# Patient Record
Sex: Female | Born: 1935 | Race: White | Hispanic: No
Health system: Southern US, Community
[De-identification: ages and names within clinical notes are randomized; demographics above are authoritative.]

---

## 2003-06-11 ENCOUNTER — Other Ambulatory Visit: Payer: Self-pay

## 2003-06-12 ENCOUNTER — Other Ambulatory Visit: Payer: Self-pay

## 2005-12-09 ENCOUNTER — Inpatient Hospital Stay: Payer: Self-pay | Admitting: Internal Medicine

## 2005-12-09 ENCOUNTER — Other Ambulatory Visit: Payer: Self-pay

## 2007-04-22 ENCOUNTER — Ambulatory Visit: Payer: Self-pay | Admitting: Physician Assistant

## 2007-05-12 ENCOUNTER — Emergency Department: Payer: Self-pay | Admitting: Emergency Medicine

## 2007-06-16 ENCOUNTER — Ambulatory Visit: Payer: Self-pay | Admitting: Cardiology

## 2007-06-16 ENCOUNTER — Other Ambulatory Visit: Payer: Self-pay

## 2007-06-16 ENCOUNTER — Ambulatory Visit: Payer: Self-pay | Admitting: Orthopedic Surgery

## 2007-06-17 ENCOUNTER — Ambulatory Visit: Payer: Self-pay | Admitting: Orthopedic Surgery

## 2007-08-07 ENCOUNTER — Encounter: Admission: RE | Admit: 2007-08-07 | Discharge: 2007-08-07 | Payer: Self-pay | Admitting: Family Medicine

## 2007-08-08 ENCOUNTER — Ambulatory Visit: Payer: Self-pay | Admitting: Internal Medicine

## 2007-08-08 ENCOUNTER — Inpatient Hospital Stay (HOSPITAL_COMMUNITY): Admission: EM | Admit: 2007-08-08 | Discharge: 2007-08-19 | Payer: Self-pay | Admitting: Emergency Medicine

## 2007-08-08 ENCOUNTER — Ambulatory Visit: Payer: Self-pay | Admitting: Emergency Medicine

## 2007-08-08 ENCOUNTER — Encounter (INDEPENDENT_AMBULATORY_CARE_PROVIDER_SITE_OTHER): Payer: Self-pay | Admitting: General Surgery

## 2007-08-13 ENCOUNTER — Ambulatory Visit: Payer: Self-pay | Admitting: Infectious Diseases

## 2007-08-22 ENCOUNTER — Ambulatory Visit: Payer: Self-pay

## 2007-09-15 ENCOUNTER — Encounter: Admission: RE | Admit: 2007-09-15 | Discharge: 2007-09-15 | Payer: Self-pay | Admitting: Family Medicine

## 2007-09-15 ENCOUNTER — Inpatient Hospital Stay (HOSPITAL_COMMUNITY): Admission: EM | Admit: 2007-09-15 | Discharge: 2007-09-17 | Payer: Self-pay | Admitting: Emergency Medicine

## 2007-10-01 ENCOUNTER — Ambulatory Visit (HOSPITAL_COMMUNITY): Admission: RE | Admit: 2007-10-01 | Discharge: 2007-10-01 | Payer: Self-pay | Admitting: Urology

## 2007-10-20 ENCOUNTER — Encounter: Admission: RE | Admit: 2007-10-20 | Discharge: 2007-10-20 | Payer: Self-pay | Admitting: Family Medicine

## 2007-10-26 ENCOUNTER — Ambulatory Visit (HOSPITAL_COMMUNITY): Admission: RE | Admit: 2007-10-26 | Discharge: 2007-10-26 | Payer: Self-pay | Admitting: Orthopedic Surgery

## 2007-11-04 ENCOUNTER — Encounter: Admission: RE | Admit: 2007-11-04 | Discharge: 2007-11-04 | Payer: Self-pay | Admitting: Family Medicine

## 2007-11-27 ENCOUNTER — Encounter: Admission: RE | Admit: 2007-11-27 | Discharge: 2007-11-27 | Payer: Self-pay | Admitting: Family Medicine

## 2008-01-04 ENCOUNTER — Encounter: Admission: RE | Admit: 2008-01-04 | Discharge: 2008-01-04 | Payer: Self-pay | Admitting: Family Medicine

## 2008-09-19 ENCOUNTER — Ambulatory Visit: Payer: Self-pay | Admitting: Vascular Surgery

## 2008-11-12 ENCOUNTER — Emergency Department: Payer: Self-pay | Admitting: Emergency Medicine

## 2009-01-30 ENCOUNTER — Ambulatory Visit: Payer: Self-pay | Admitting: Vascular Surgery

## 2009-02-06 ENCOUNTER — Ambulatory Visit: Payer: Self-pay | Admitting: Vascular Surgery

## 2009-02-25 IMAGING — CR DG ANKLE COMPLETE 3+V*L*
1 series · 4 of 4 positions shown · non-contrast
Comparison: none

REASON FOR EXAM: Left leg pain, weak pulse
COMMENTS:

PROCEDURE:     DXR - DXR ANKLE LEFT COMPLETE  - April 22, 2007 [DATE]
RESULT:     Images of the LEFT ankle demonstrate some mild degenerative
changes and osteopenia with no definite fracture, dislocation or radiopaque
foreign body.

[Series 1: view not recorded · 0.17mm/px · 4 of 4 slices shown]
[im 1/4]
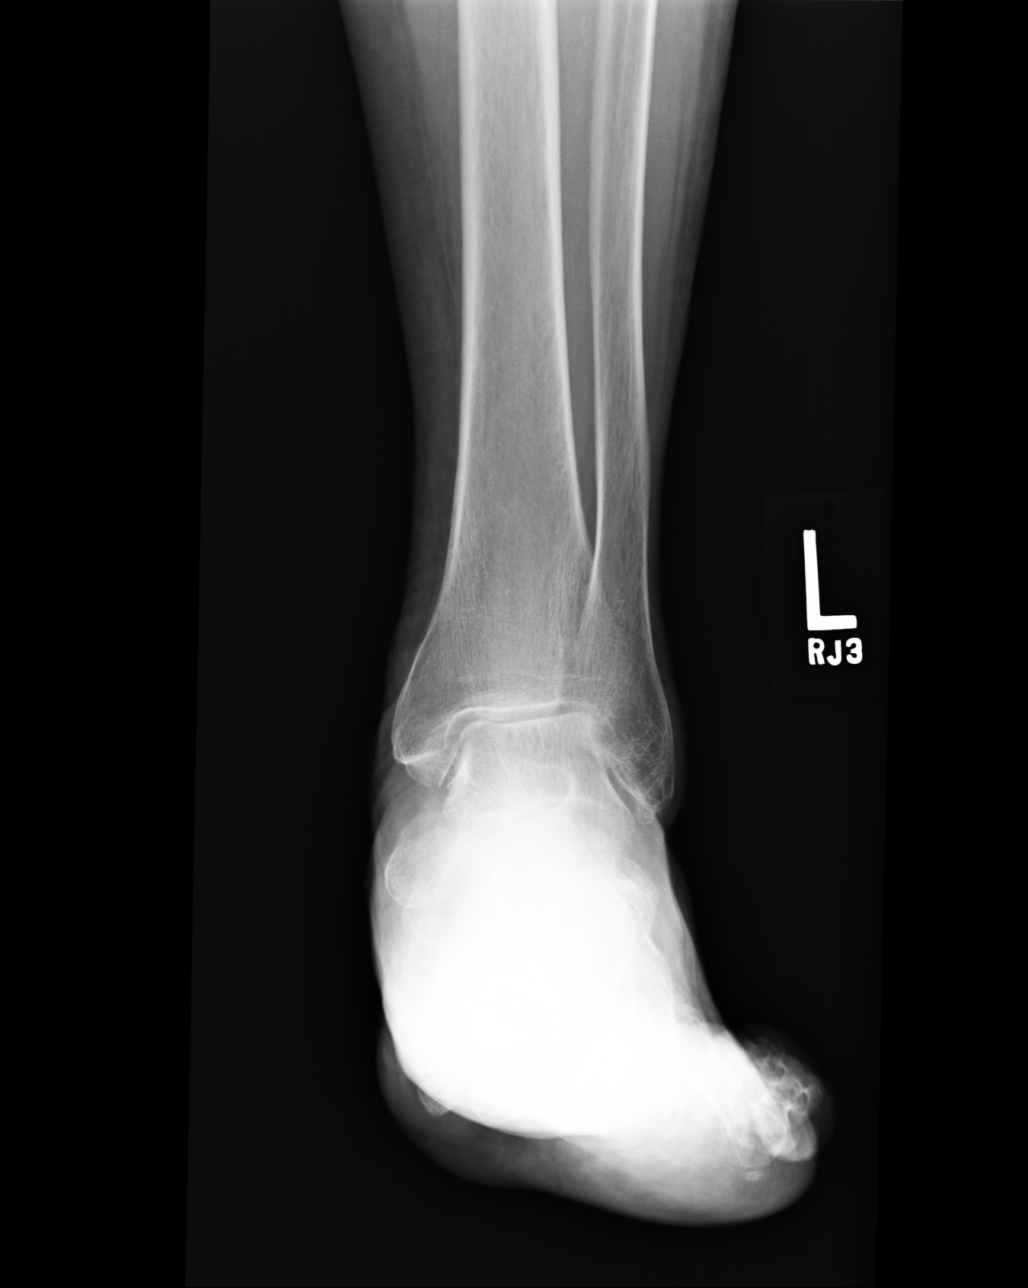
[im 2/4]
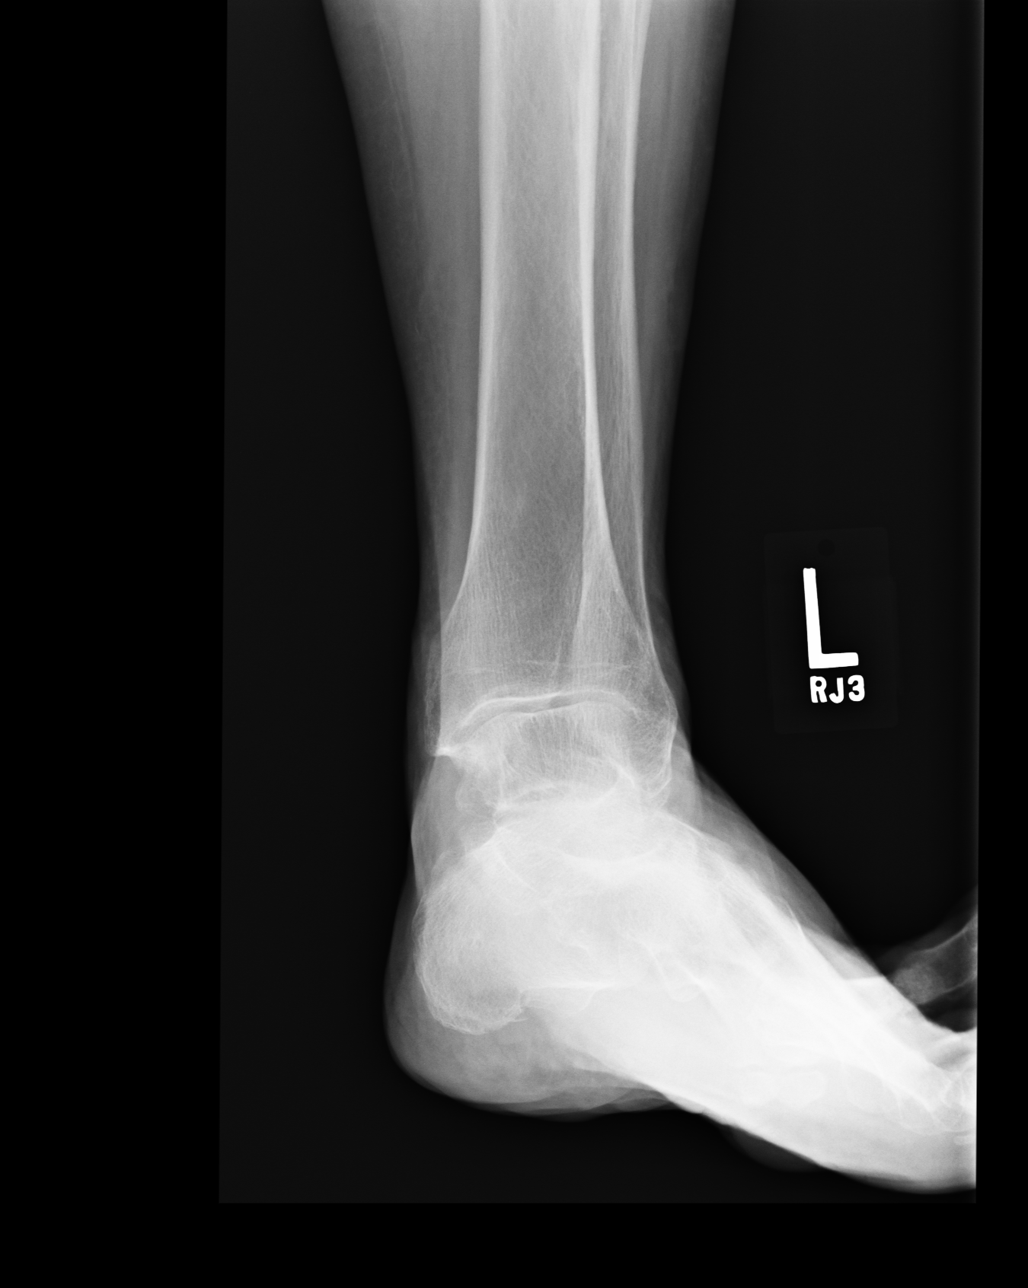
[im 3/4]
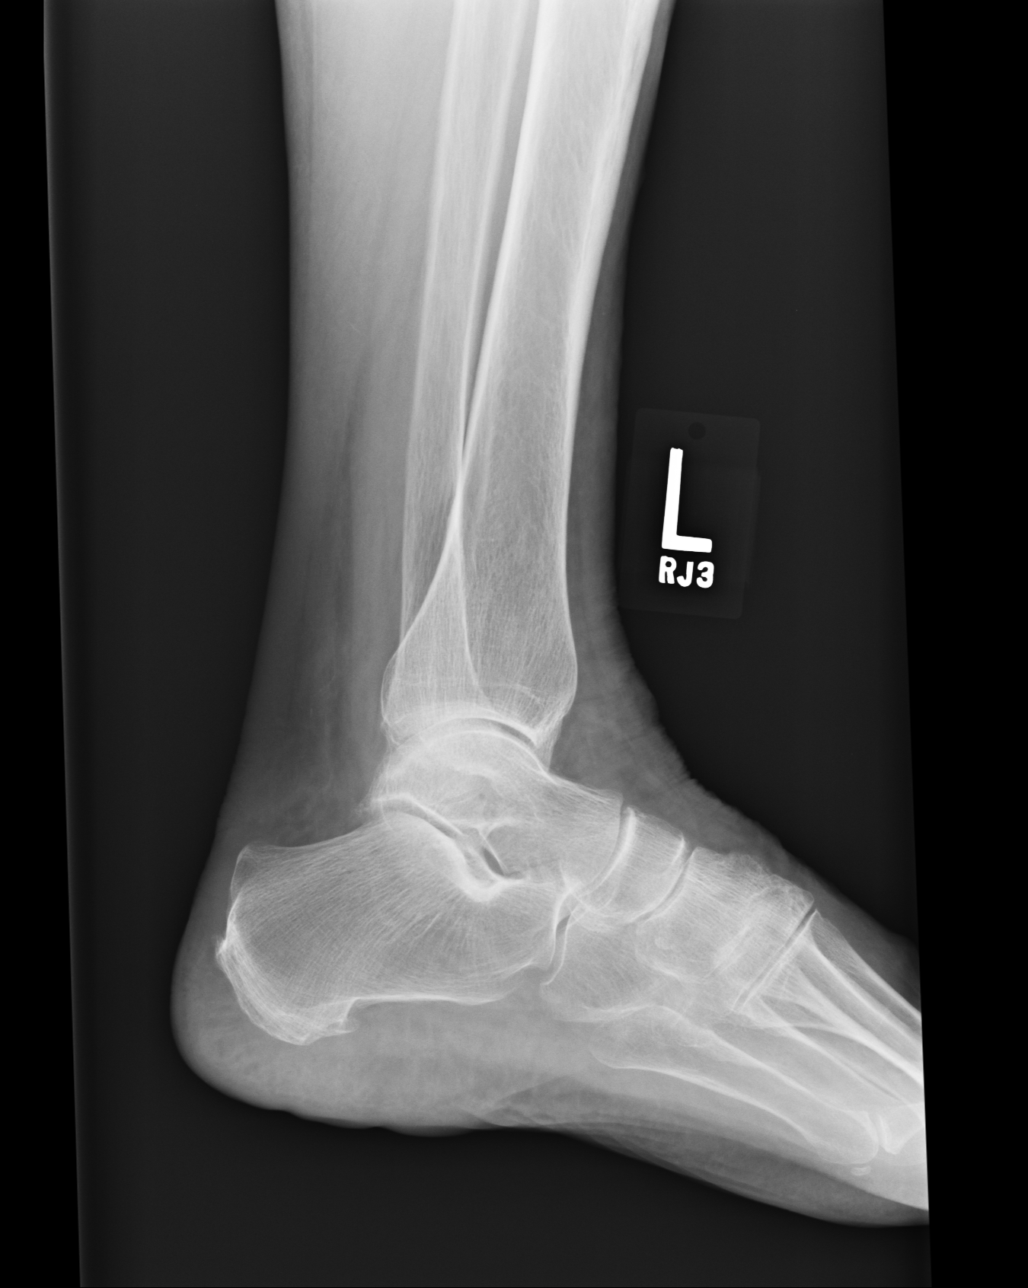
[im 4/4]
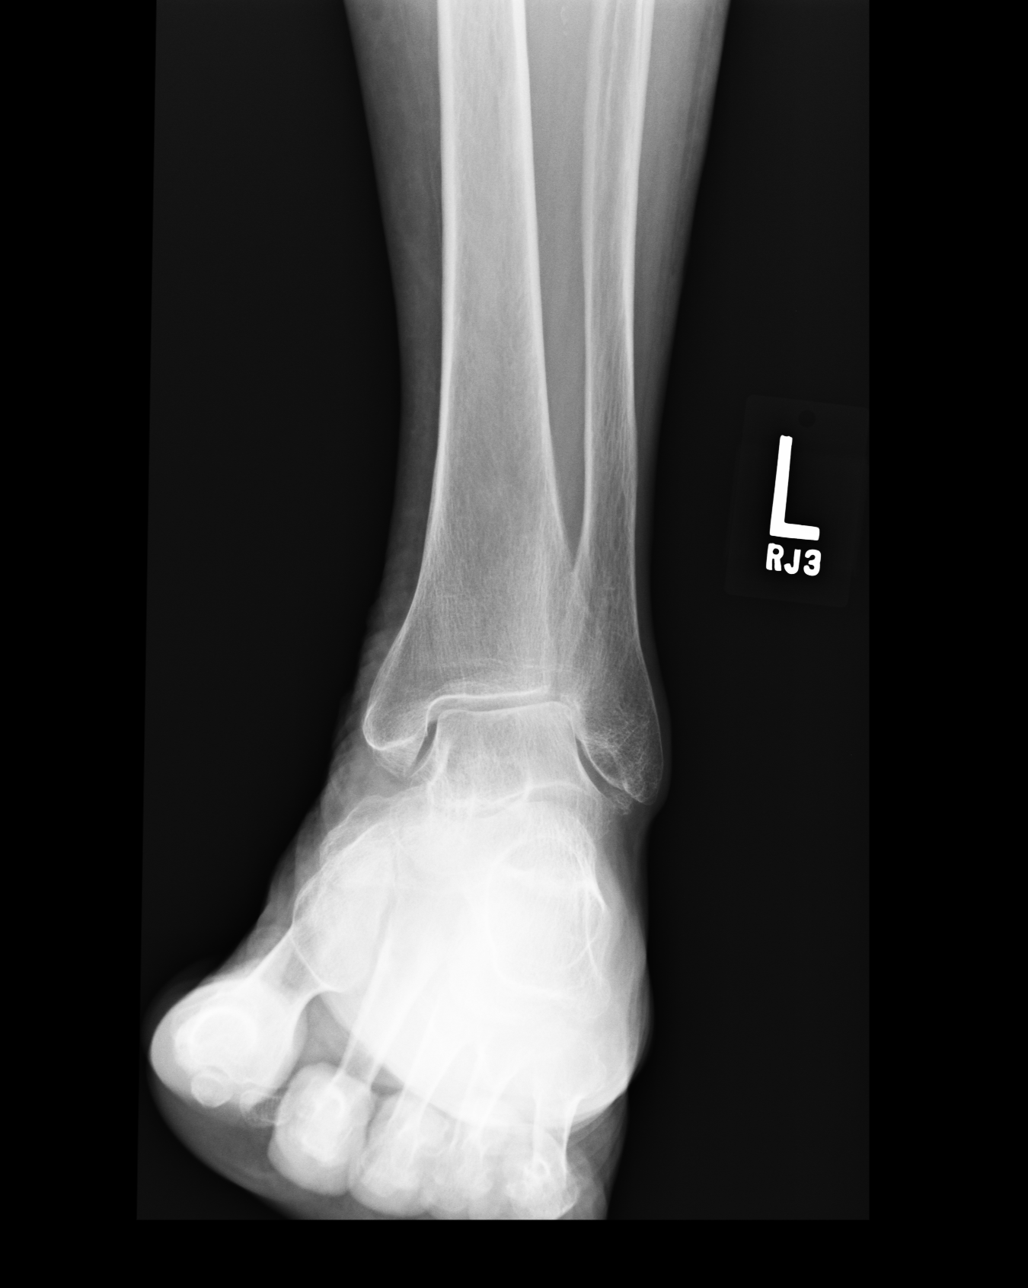

[4 of 4 positions shown; findings below may reference images not displayed]

IMPRESSION: Please see above.

## 2009-06-15 IMAGING — CR DG CHEST 1V PORT
1 series · 1 of 1 positions shown · non-contrast
Comparison: Earlier today.

CLINICAL DATA: Yamilet?Neurimar gangrene. 
 PORTABLE CHEST - 1 VIEW AT 5664 HOURS:

[view not recorded]
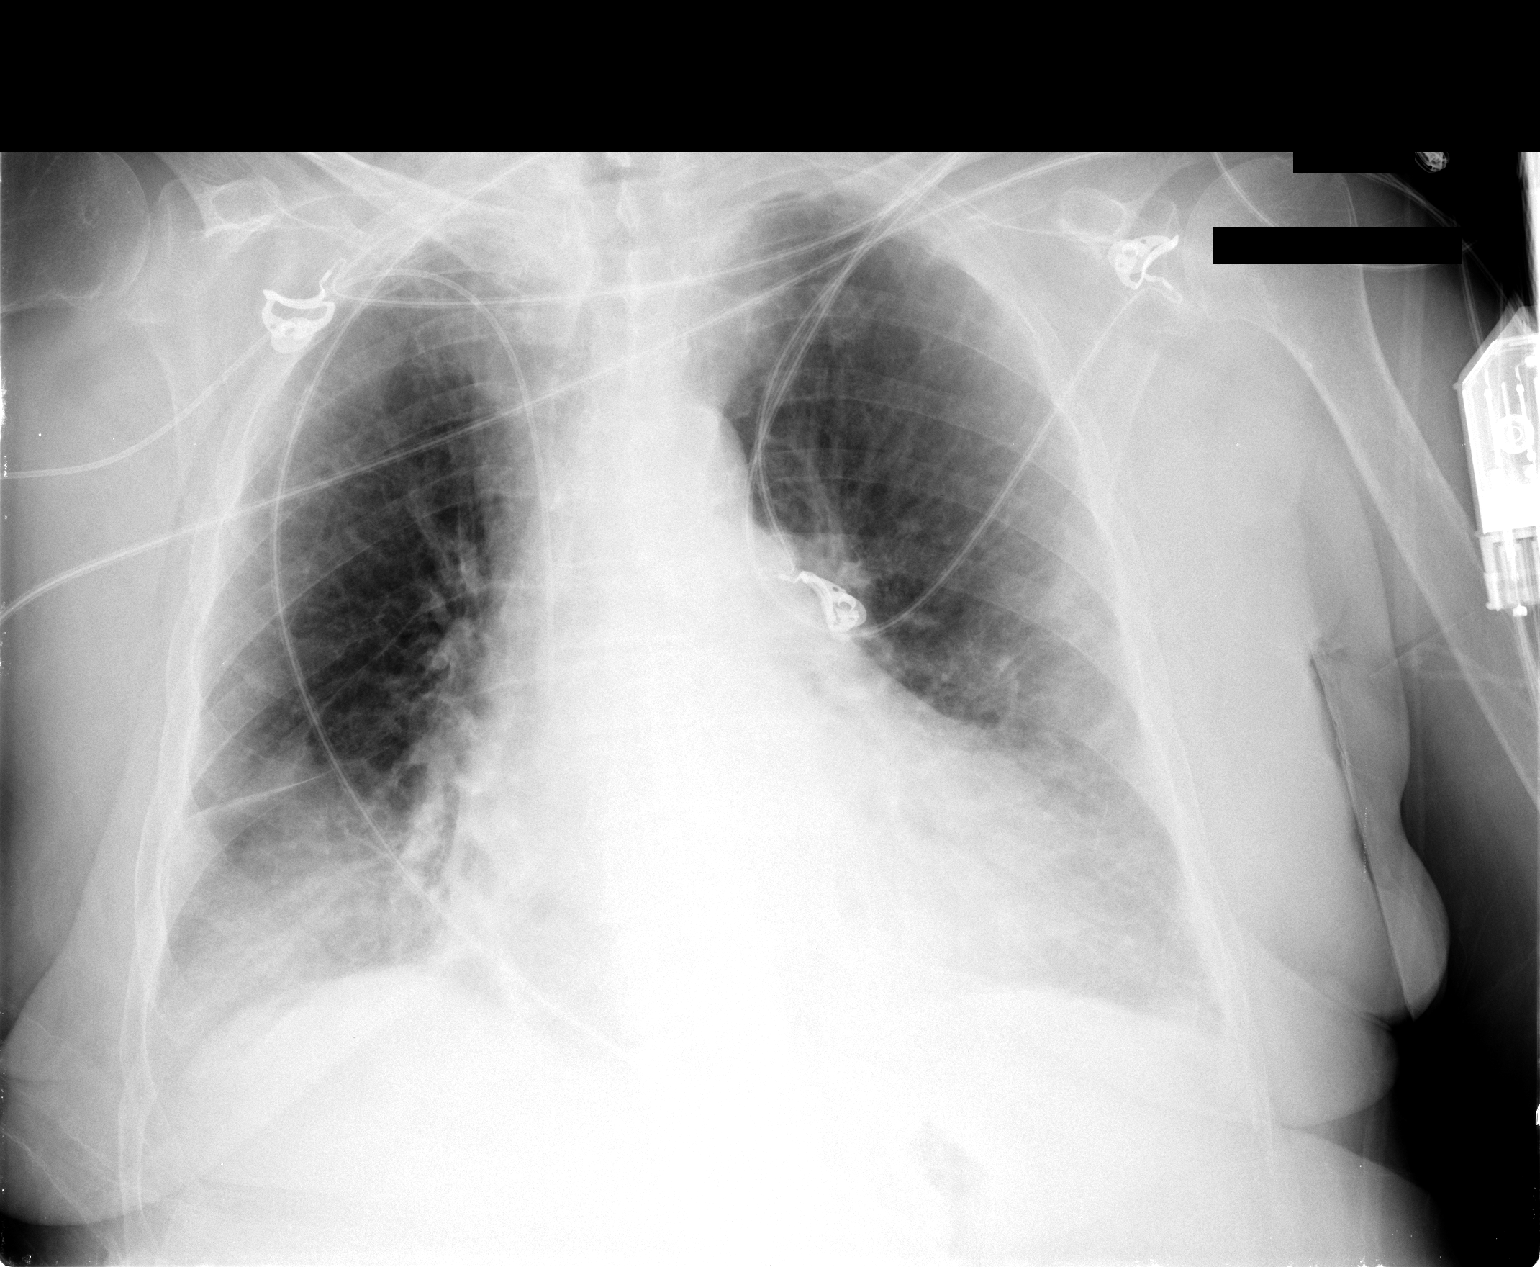

[1 of 1 positions shown; findings below may reference images not displayed]

FINDINGS: Right PICC line is present with the tip in the mid-SVC.  No pneumothorax is seen.  Edema is again noted with volume loss at the right base as well.
IMPRESSION: Right PICC tip in mid-SVC.  No pneumothorax.

## 2009-06-21 ENCOUNTER — Inpatient Hospital Stay: Payer: Self-pay | Admitting: Specialist

## 2009-06-29 ENCOUNTER — Encounter: Payer: Self-pay | Admitting: Internal Medicine

## 2009-07-11 ENCOUNTER — Ambulatory Visit: Payer: Self-pay | Admitting: Emergency Medicine

## 2009-07-12 ENCOUNTER — Emergency Department: Payer: Self-pay | Admitting: Emergency Medicine

## 2009-07-18 ENCOUNTER — Encounter: Payer: Self-pay | Admitting: Internal Medicine

## 2009-07-22 IMAGING — CR DG ABDOMEN 1V
1 series · 1 of 1 positions shown · non-contrast
Comparison: None

CLINICAL DATA: This again a cutaneous fistula.

ABDOMEN - 1 VIEW

[view not recorded]
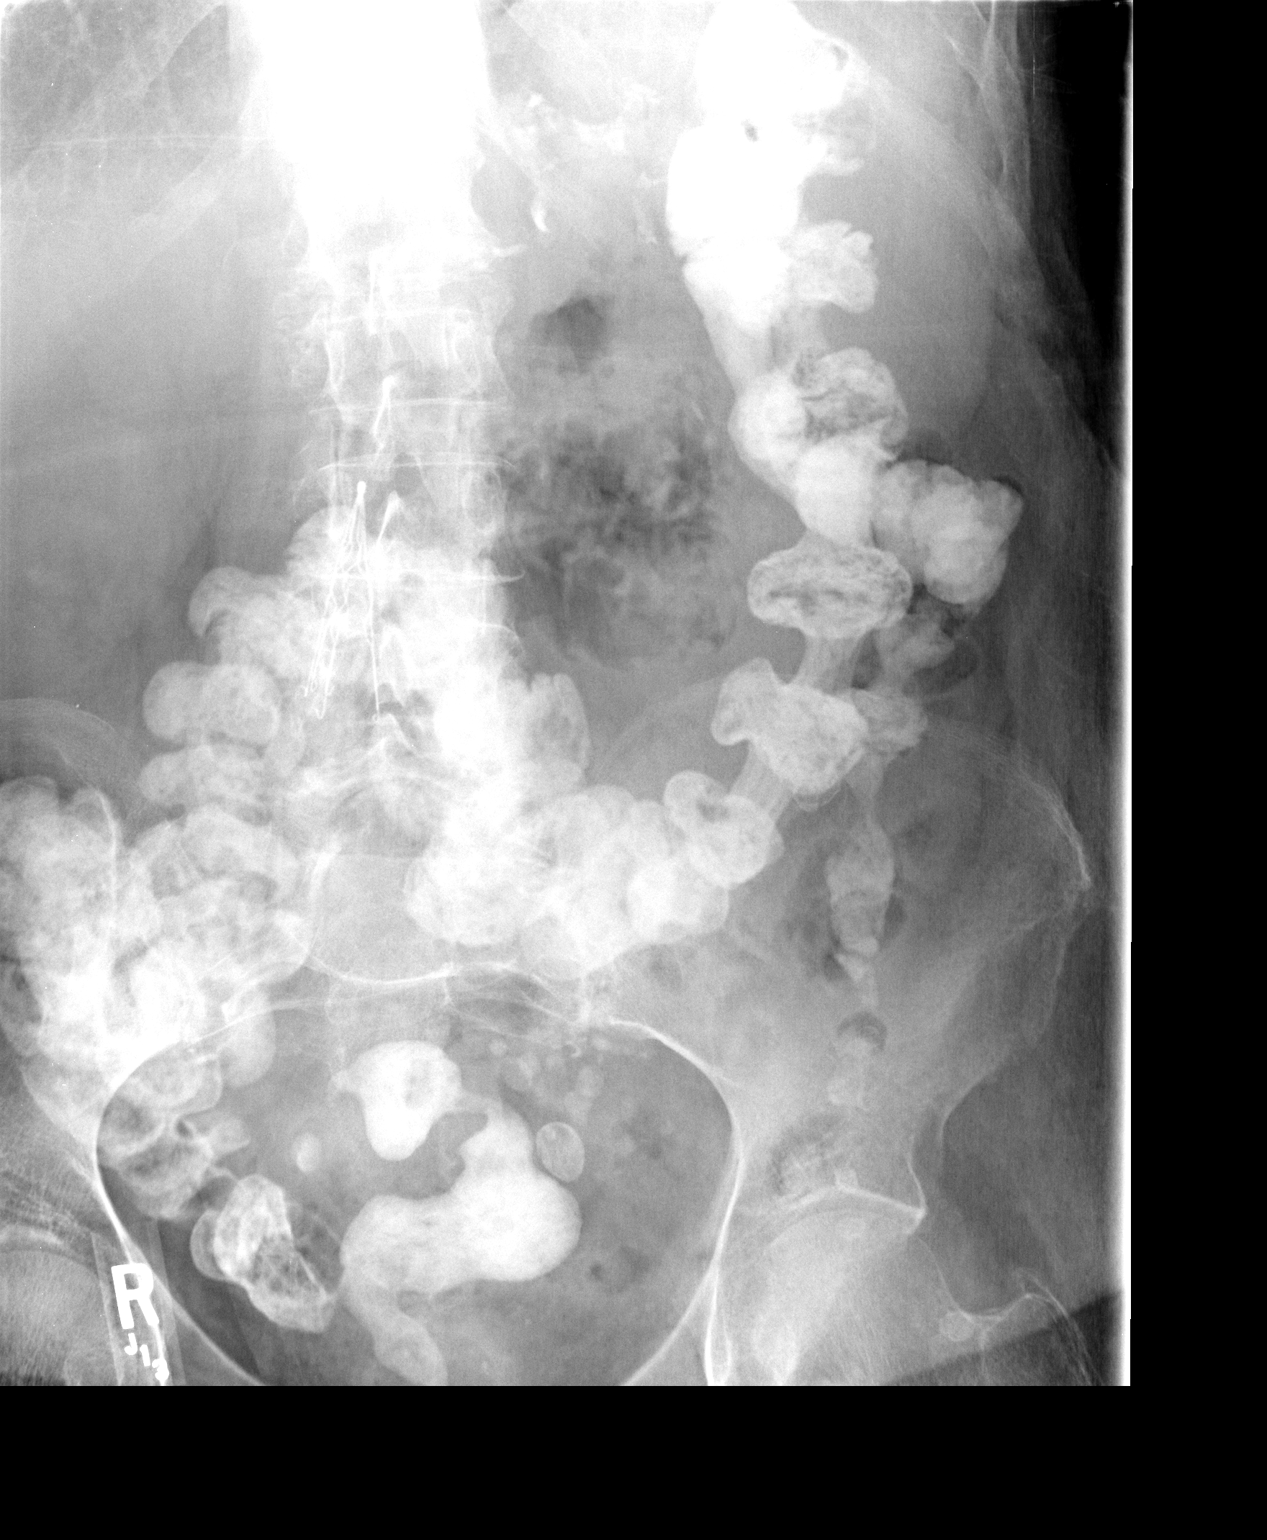

[1 of 1 positions shown; findings below may reference images not displayed]

FINDINGS: There is retained contrast throughout the colon.
IMPRESSION: Retained contrast throughout the colon.

## 2010-01-29 ENCOUNTER — Ambulatory Visit: Payer: Self-pay | Admitting: Vascular Surgery

## 2010-06-20 ENCOUNTER — Ambulatory Visit: Payer: Self-pay | Admitting: Physician Assistant

## 2010-08-13 ENCOUNTER — Ambulatory Visit: Payer: Self-pay | Admitting: Physician Assistant

## 2010-10-30 NOTE — Discharge Summary (Signed)
[name], [name]                ACCOUNT NO.:  [id number]   MEDICAL RECORD NO.:  [id number]          PATIENT TYPE:  INP   LOCATION:  4710                         FACILITY:  [facility]   PHYSICIAN:  Gabrielle Dare. Janee Morn, M.D.DATE OF BIRTH:  [birth year]   DATE OF ADMISSION:  08/07/2007  DATE OF DISCHARGE:  08/19/2007                               DISCHARGE SUMMARY   ADMITTING PHYSICIAN:  Velora Heckler, MD.   DISCHARGING PHYSICIAN:  Gabrielle Dare. Janee Morn, MD.   OPERATIVE PHYSICIAN:  Gabrielle Dare. Janee Morn, MD.   CONSULTANTS:  1. Mcarthur Rossetti. Tyson Alias, MD, with critical care medicine.  2. Stann Mainland. Sampson Goon, MD, with internal medicine teaching service B.   CHIEF COMPLAINT AND REASON FOR ADMISSION:  [name] is a 75 year old  female patient who resides in [place], [place], who has a  history of diabetes, CAD, hypertension, hypothyroidism, seizure disorder  as well as GERD.  She had been sick for 10 days with intermittent fever,  chills, lower abdominal pain and pelvic girdle pain.  She was seen by  her primary care physician in [place] and was treated with  antibiotics, unknown type, without any improvement in her symptoms.  She  was seen today and because of the pain, she was sent to [facility] for a CT of the pelvis.  This showed a soft tissue infection  with gas-forming organisms involving the right rectus sheath, left  adductor musculature, perineum, perirectal tissues and vulva.  There  also appeared to be a small perivesical abscess.  The patient was  subsequently transferred from [facility] to the [facility]  emergency department, where she was seen initially by the emergency room  physician.  CT scan was reviewed and then general surgery, Dr. Gerrit Friends,  was consulted to evaluate and manage the patient for probable Fournier  gangrene.   On initial exam, the patient was a frail-appearing white female in mild  to moderate pain.  Her temperature was 102.1, pulse  was 119, BP was 89/49, respirations 20.  Physical exam was unremarkable except for the following:  CARDIAC:  Her peripheral pulses were weak.  EXTREMITIES:  Normal in appearance without any edema.  She has a chronic  wound on the left lower extremity and a Cam walker boot was in place.  ABDOMEN:  Soft.  She had bowel sounds present.  There were no cutaneous  changes of the lower abdominal wall, no signs of cellulitis, no  erythema.  Mild tenderness in the right lower quadrant of the abdominal  wall.  Mild tenderness around the pubis.  Mild tenderness on the perineum.  No cutaneous changes on the perineum.  No drainage.   Her laboratory studies revealed a white count 16,300, hemoglobin 11.2,  platelets 176,000, neutrophils 91%.  Sodium 125, potassium 4.9, glucose  368, BUN 14, creatinine 1.05.  LFTs showed mildly elevated SGOT but  normal total bilirubin and elevated alkaline phosphatase of 346.  A  chest x-ray was done.  This demonstrated COPD.  In addition to the other  abnormal findings of her CT of the abdomen and pelvis, she  was also  found to have gallstones but no evidence of acute cholecystitis.  There  also was the soft tissue infection with gas-forming organisms in the  right rectus, left adductor muscle, perineum and perirectal tissues.  In  the vulva, again there appeared to be a small 2-3 cm paravesical abscess  on the right.   The patient was admitted by Dr. Gerrit Friends with the following diagnoses:  1. Soft tissue infection involving the structures as mentioned above      worrisome for developing Fournier gangrene.  2. Right perivesical abscess.  3. Diabetes mellitus, uncontrolled, with hyperglycemia with associated      hyponatremia.  4. Known hypertension.  Patient currently hypotensive/probable volume      depletion secondary to infection as well as volume depletion from      hyperglycemia, rule out early sepsis.  5. Gastroesophageal reflux disease.  6. Hypothyroidism on  Synthroid.  7. Known seizure disorder on Dilantin.  8. History of atrial fibrillation.   HOSPITAL COURSE:  The patient was admitted by Dr. Gerrit Friends and was taken  from the emergency department to the OR by Dr. Janee Morn, where she  underwent an incision and drainage of the suprapubic area with  debridement as well as incision and drainage of the left adductor  musculature with debridement.  Cultures were obtained.  This was done  for a necrotizing soft tissue infection in these same areas involving  the suprapubic area and the left abductor musculature and left medial  thigh.  In the immediate postoperative period the patient was left  intubated and was sent back to the critical care unit to recover.  Because of her multiple comorbidities and presentation with high fever,  leukocytosis, necrotizing infection and hypotension with associated  tachycardia and high fever, it was felt she was a candidate for the  development of sepsis with possible shock.  Empiric antibiotic coverage  initially was with vancomycin, and Zosyn and clindamycin.  Also, because  of her cardiac history, known atrial fibrillation and also a history of  CAD, a cardiology consult was obtained and Dr. Algie Coffer followed along  with [place] throughout the entire hospitalization.  Because she was  intubated and at increased risk for development of septic shock,  critical care medicine, Dr. Tyson Alias, was consulted and assisted with  the patient's care.  In the immediate postoperative period the patient  did require pressor agents for blood pressure support but these were  weaned off quickly, and she was also extubated by postoperative day #2,  at which point her BP was 97/40, pulse was 128, T-max 99.7, hemoglobin  8.8, creatinine 0.8, potassium 3.4.  White count of this point was  11,200.   Because of poor IV access, a PICC line was also inserted.  During the  initial portion of the patient's hospitalization while critical  care  medicine was following the patient, they managed all of her internal  medicine needs including management of her hypothyroidism, seizure  disorder, diabetes.  Cardiology managed the issues related to  cardiovascular disease.  Because of presentation with a necrotizing soft  tissue infection, an infectious disease consult was obtained.  Dr.  Paulette Blanch Dam also followed along throughout the entire  hospitalization.   On postoperative day #3 the patient had been transferred out to the  telemetry floor.  Her labs were stable.  She was tolerating a p.o. diet.  She was still tachycardiac and mildly hypotensive.  Her rhythm was  atrial fibrillation, again with a  rapid ventricular response.  Her  abdomen was soft, tender only around the wounds.  The wounds were clean  and pink and packed with gauze deeply.  Her wounds had been treated with  normal saline gauze packing as well as daily pulse lavage and she was  responding well to this.   On the same postoperative day, #3, the patient began having worsening  hypotension.  Her blood pressure had been staying systolically in the  90s and now was going into the 70s.  The patient was becoming more  confused.  She developed some crackles in the right base.  She was given  several fluid challenges but she did not respond to these with improved  blood pressure.  She was complaining of buttock pain.  She was rolled.  No evidence of evolving abscess or problems there.  Wounds were stable.  The patient was subsequently transferred to the critical care unit for  anticipation of need of pressors and there was concern she was going to  evolve into a multisystem organ failure and with evolving sepsis,  critical care medicine was reconsulted.  The patient had already been  pancultured 24 hours before by infectious disease and labs were repeated  including a CBC, CMET, ABG, DIC panel, prior to transfer to the critical  care unit.   After transfer to  the critical care unit, Dr. Tyson Alias reassumed care  of the patient.  Her blood pressure was stabilized with the use of  pressors.  Her white count was 10,000.  ABG showed no respiratory  failure that would warrant reintubation.  She was continued on nasal  cannula O2 and watched very closely.  Her chest x-ray did show some  possible right basilar pneumonia.  During the same time period the  patient's antibiotic coverage was readjusted.  She was continued on  Zosyn.  Cipro had been added.  She was continued on vancomycin and  Diflucan had already been added by infectious disease.  At this point  all initial cultures showed no growth to date.  This included urine,  blood, abscess and anaerobics.  At this point from a clinical standpoint  her only potentially significant problem was related to her confusion  and difficulty with rate control of her atrial fibrillation, which  cardiology was managing with medication.  During the time that the  patient was also critically ill and required multiple pressor agents,  despite having a PICC line she did not have enough adequate IV access,  so a central line was inserted to ensure adequacy for infusing multiple  drips and obtaining blood draws.  By postoperative day #5 the patient  was otherwise deemed appropriate for transfer back out of the critical  care unit.  At this point she was afebrile, BP was 136/51 with a pulse  of 87.  Hemoglobin 9.8, white count 5600.  Creatinine 0.7, potassium  3.7, sodium 138.  Her antibiotic coverage remained stable with  vancomycin, Cipro, Diflucan and Zosyn.  After transfer out of the  critical care unit, internal medicine consult was obtained to assist  with managing the patient's multiple medical problems.  Dr. Sampson Goon  with internal medicine teaching service B consulted on the patient and  has managed the patient's medical problems up until time of discharge.   The patient's initial blood cultures at time of  admission grew out one  at of two bottles positive for coagulase-negative staph.  All other  subsequent cultures from the time of admission and  repeat cultures have  been negative.  The patient had been placed on ciprofloxacin after being  transferred back to the unit a second time because of possible  pneumonia.  ID eventually discontinued the Cipro and the patient has  remained on Diflucan, vancomycin and Zosyn since.  Based on the fact  that the patient had a necrotizing wound infection and had two episodes  of hypotension requiring pressors, they recommended to have the patient  receive antibiotic therapy with the above-stated medications for a total  of 21 days' duration.   At this time the patient was otherwise appropriate for discharge.  Her  medical problems were stable.  She was able to tolerate a diet and her  only difficulties at this point were with rehab.  In addition, she was  receiving hydrotherapy six times a week with morning dressing change and  this was helping keep the wound base very clean.  Because of the  patient's deconditioned state and need for aggressive wound care, it was  opted to have the patient evaluated for rehab at a skilled nursing  facility.  Several facilities after FL2 was faxed and facilities  approved the patient for potential admission to their facility.  By date  of discharge, August 19, 2007 the patient was otherwise deemed appropriate  for discharge to skilled nursing facility.  Family had accepted at bed  to [facility].   In regards to the patient's deconditioning needs, the patient has been  followed by physical therapy this admission.  At times she has been  incontinent urine and feces and has required total assistance with  hygiene.  Per physical therapy's last note, the patient was still having  difficulty with transferring from the supine to the upright position  requiring a total assist to moderate assist.  They were working on   transfers with her from sit to stand with total assist and stand-pivot  transfer with total assist to providers for recliner to bed.  At this  point the patient was unable to advance to working on gait because she  was having significant safety issues because of the total assist and was  unable to advance left lower extremity independently to regain adequate  strength and posture.  Please refer to physical therapy progress notes.  So prior to discharge the patient had not been ambulating without any  assistance and was essentially unable to ambulate with assistance due to  profound deconditioning.  Expect at some point once she is ready for  this, she will at  least need a rolling walker.  She will need to  continue physical therapy and occupational therapy upon arrival to the  skilled nursing facility and they will need to reevaluate the patient  and determine needs.  PT was also performing the hydrotherapy as  described, and this will continue after discharge as well.   The following labs have been obtained during the hospitalization and are  as follows.  Dilantin level on February 25 was 8.5.  The patient  normally on 300 mg of Dilantin at hour of sleep.  She was receiving IV  Dilantin and then 100 mg p.o. b.i.d. up until time of discharge.  Her  TSH was 6.421 on August 12, 2007.  This was obtained after the patient  had not been receiving Synthroid for several days.  Her home Synthroid  dose was 200 mcg daily.  The patient was receiving 50 mcg p.o. daily  while here.  Her digoxin level  on August 16, 2007, was less than 0.2.  This is a new medication started this admission for rate control with  her atrial fibrillation.  Today's white count was 4900, neutrophils 70%,  platelets 162,000, hemoglobin 10.8.  Sodium 140, potassium 4.0, CO2 32,  glucose 202, BUN 4, creatinine 0.82.   The patient has also had several new medications added this admission:  1. Digoxin as described above.  This  was added on February 27 for a      control.  2. A nicotine patch was also started to minimize nicotine withdrawal      symptoms.  3. Her Lantus dose has been changed from 52 units preadmission at hour      of sleep to 22 units at hour of sleep.  Over the past 24 hours she      has required 12 units of sliding scale insulin, so I have increased     her Lantus to 32 units at hour of sleep.  4. Also at home the patient was taking 10 mEq of potassium daily.      This has been increased to 20 mEq daily.   At date of discharge, the patient's vital signs a stable with a  temperature of 97.2, BP 134/78, pulse varies from 67 to 93 beats per  minute and low BP while sleeping was 94/51.  Dr. Janee Morn was the  examining physician today on around.  The patient's wounds were clean  and pink and responding well to the hydrotherapy, and he has recommended  we can continue that after discharge.  In addition, she has a PICC line  in place and this will need to remain in place after discharge until she  completes her antibiotic therapies as previously described.   FINAL DISCHARGE DIAGNOSES:  1. Complicated soft tissue infection, status post incision and      drainage of the suprapubic area with debridement and incision and      drainage of the left adductor muscle with debridement secondary to      necrotizing soft skin infection.  2. Sepsis and shock requiring critical care stay and pressor agents x2      this admission.  3. Postoperative ventilator-dependent respiratory failure, resolved.  4. Diabetes with history of significant hyperglycemia this admission,      stable.  5. Hypothyroidism with mildly elevated TSH.  6. History of seizure disorder without any seizures this admission.  7. History of hypertension, controlled.  8. Known coronary artery disease and myocardial infarction, stable.      Has been off Plavix this hospitalization, will resume.  9. History of gastroesophageal reflux  disease.  10.Atrial fibrillation, not on Coumadin, with issues with rapid      ventricular response and rate control while critically ill.      Currently rate is controlled.   DISCHARGE MEDICATIONS:  1. Singulair 10 mg daily.  2. Phenytoin 300 mg nightly.  3. Metoprolol ER 25 mg daily.  4. Enteric coated aspirin 325 mg daily.  5. Plavix 75 mg daily.  6. Lisinopril 5 mg daily.  7. Potassium chloride, new dose, increased to 20 mEq daily.  8. Levothyroxine 200 mcg daily.  9. Prevacid 30 mg daily.  10.Lantus insulin 32 units subcu nightly.  11.Check Glucometer [place] a.c. and nightly and sliding scale insulin per      skilled nursing facility MD preference.   New medications include:  1. Nicotine patch 21 mg daily.  2. Percocet 5/325 mg 1-2 tablets  every 4 hours p.r.n. pain.  The      patient has been using this every 4-6 hours while here, especially      prior to dressing changes.  3. Digoxin 0.125 mg daily.  4. Diflucan 100 mg p.o. daily x12 more doses, then discontinue.  5. Zosyn 3.375 g IV q.8 h. x10 more days.  6. Vancomycin, pharmacy dose, x10 more days.   DIET:  Low-sodium, heart-healthy, modified-carb.   WOUND CARE:  Normal saline packing with 4x4 or bulky bandage b.i.d.,  cover with dry dressing.  The patient needs hydrotherapy to the wounds  daily with a.m. wound care.   ACTIVITY:  Directed per PT and OT recommendations after arrive to  skilled facility.  Basic instructions include increase activity slowly,  walk with assistance with assistance and/or assistive device such as a  walker per PT recommendations.  Walk up steps per PT recommendations.  Currently the patient has not been participating in gait therapy due to  profound deconditioning.   FOLLOW-UP APPOINTMENTS:  Once the patient is discharged from the skilled  nursing facility, she needs to call Dr. Janee Morn, telephone number [phone]  [phone].  She needs to be seen in the next 2-3 weeks or in 1 week after   discharge.      Allison L. Rennis Harding, N.P.      Gabrielle Dare Janee Morn, M.D.  Electronically Signed    ALE/MEDQ  D:  08/19/2007  T:  08/19/2007  Job:  [id number]   cc:   Ricki Rodriguez, M.D.  Acey Lav, MD  Burnell Blanks, MD

## 2010-10-30 NOTE — H&P (Signed)
NAMEJALILA, [name]                ACCOUNT NO.:  [id number]   MEDICAL RECORD NO.:  [id number]          PATIENT TYPE:  EMS   LOCATION:  MAJO                         FACILITY:  [facility]   PHYSICIAN:  Velora Heckler, MD      DATE OF BIRTH:  26-Jan-1936   DATE OF ADMISSION:  08/07/2007  DATE OF DISCHARGE:                              HISTORY & PHYSICAL   CHIEF COMPLAINT:  Rule out Fournier's gangrene.   HISTORY OF PRESENT ILLNESS:  The patient is a 75 year old white female  from [place], [place].  The patient's daughter accompanies  her.  They report approximately a 10 day illness with intermittent  fever.  The patient has a significant past medical history for diabetes,  coronary artery disease, hypertension, hypothyroidism, gastroesophageal  reflux and seizure disorder.  Over the past 10 days the patient has  experienced fever, chills, lower abdominal and pelvic girdle pain.  She  has been seen by her primary care physician and treated with antibiotics  without improvement.  The patient was seen today and referred to  [facility] imaging where CT scan of the pelvis was performed.  This  showed a soft tissue infection with gas forming organisms involving the  right rectus sheath, left adductor musculature, perineum, perirectal  tissues and vulva.  There also appears to be a small perivesical  abscess.  The patient was sent from imaging to the [facility] emergency  department.  After a significant delay the patient was seen by the  emergency room physician and CT scan results were reviewed.  General  surgery was then consulted to evaluate and manage the patient.   PAST MEDICAL HISTORY:  History of diabetes, history of gastroesophageal  reflux, history of hypertension, history of hypothyroidism, history of  seizure disorder, status post ORIF left ankle December of 2008 [facility], history of cerebral aneurysm clipping 1995.   MEDICATIONS:  Aspirin, Lantus,  levothyroxine, metoprolol, phenytoin,  Plavix, Prevacid, Singulair.   ALLERGIES:  None known.   SOCIAL HISTORY:  The patient lives with her daughter who accompanies her  tonight.  She does smoke.  She denies alcohol use.  She has 7 children.   FAMILY HISTORY:  Noncontributory.   REVIEW OF SYSTEMS:  The patient notes decreased appetite.  She has had  normal bowel movements this week.  The patient is unable to stand due to  pain in the upper legs and pelvic region.   PHYSICAL EXAMINATION:  GENERAL:  A 75 year old frail-appearing white  female on a stretcher in the emergency department in mild to moderate  discomfort.  VITAL SIGNS:  Temperature 102.1, pulse 119, blood pressure 89/49,  respirations 20, O2 saturation 94% on 2 liter nasal cannula.  HEENT:  Shows her to be normocephalic, atraumatic.  Sclerae clear.  Conjunctivae clear.  Pupils 4 mm bilaterally and reactive.  Dentition  fair.  Mucous membranes moist.  Voice normal.  NECK:  Palpation of the neck shows no thyroid nodularity.  No  lymphadenopathy.  No tenderness.  LUNGS:  Show distant breath sounds bilaterally.  No wheeze.  No  rhonchi.  No rales.  CARDIAC:  Exam shows regular rate and rhythm.  Peripheral pulses are  weak.  EXTREMITIES:  Nontender without edema.  There is a Cam walker boot on  the left lower extremity.  ABDOMEN:  Abdomen is soft.  There are bowel sounds on auscultation.  There are no cutaneous changes of the lower abdominal wall.  There is no  sign of cellulitis.  There is no erythema.  There is mild tenderness of  the right lower quadrant of the abdominal wall.  There is mild  tenderness around the pubis.  There is mild tenderness on the perineum.  There are no cutaneous changes on the perineum.  There is no drainage.  EXTREMITIES:  There are no significant cutaneous lesions in the upper  thighs.  Lower extremity range of motion is limited at the hips and  knees secondary to discomfort.  NEUROLOGICAL:   The patient is alert and oriented.  She responds  appropriately to questioning.  There is no obvious focal neurologic  deficit.   LABORATORY STUDY:  White count 16.3, hemoglobin 11.2, hematocrit 33.7%,  platelet count 176,000.  Differential shows 91% neutrophils, 5%  lymphocytes, 4% monocytes.  Electrolytes are notable for a low sodium of  125, potassium 4.9, bicarb of 22, glucose 368, BUN 14, creatinine 1.05.  Liver function tests show an elevated SGOT of 72 but a normal total  bilirubin of 1.1.  The patient does have an elevated alkaline  phosphatase of 346.  Urinalysis shows large blood and large glucose.   RADIOGRAPHIC STUDIES:  Chest x-ray shows COPD.  CT scan of abdomen and  pelvis shows gallstones.  There appears to be a soft tissue infection  with a gas forming organism involving the right rectus sheath, the left  adductor musculature, the perineum, the perirectal tissues, the vulva  and there appears to be a small approximately 2-3 cm perivesical abscess  on the right.   IMPRESSION:  1. Soft tissue infection involving rectus sheath, adductor      musculature, perineum, perirectal tissues and vulva worrisome for      developing Fournier's gangrene.  2. Right perivesical abscess.  3. Hyponatremia.  4. Hypertension.  5. Gastroesophageal reflux.  6. Diabetes mellitus.  7. Hypothyroidism.  8. Seizure disorder.  9. History of atrial fibrillation.   PLAN:  The patient will be admitted on the general surgery service at  [facility].  Intravenous antibiotics have been initiated by the  emergency room physician with Zosyn and vancomycin.  Infectious disease  consultation will be obtained on February 21.  The patient will be  admitted to the step-down unit or ICU for monitoring.  Electrolytes will  be corrected.  Sliding scale insulin will be employed to correct  hyperglycemia.  The patient will  require repeat CT scanning within 24 hours to evaluate for progressive   disease.  I discussed with the patient and her daughter the likelihood  of acute surgical intervention with debridement and drainage.  I told  them that this could be extensive.  They understand.      Velora Heckler, MD  Electronically Signed     TMG/MEDQ  D:  08/08/2007  T:  08/09/2007  Job:  [id number]   cc:   Markham Jordan L. Effie Shy, M.D.  Burnell Blanks, MD

## 2010-10-30 NOTE — Discharge Summary (Signed)
[name], [name]                ACCOUNT NO.:  [id number]   MEDICAL RECORD NO.:  [id number]          PATIENT TYPE:  INP   LOCATION:  5507                         FACILITY:  [facility]   PHYSICIAN:  Alfonse Ras, MD   DATE OF BIRTH:  [birth year]   DATE OF ADMISSION:  09/15/2007  DATE OF DISCHARGE:  09/17/2007                               DISCHARGE SUMMARY   CHIEF COMPLAINT AND REASON FOR ADMISSION:  [name] is a 76 year old  female patient treated by [facility] in consultation on  August 08, 2007, for suprapubic and left medial thigh necrotizing skin  infections.  Dr. Janee Morn performed I&D on these, and the patient was  subsequently discharged on August 19, 2007, to [facility] in  [place] and later went home.  Please refer to H&P for details.   Since being home, the patient had redeveloped lower abdominal pain and  had some increasing drainage from her lower abdominal wound.  Primary  care physician was concerned she might be developing a wound infection,  so he sent her for CT of the abdomen and empirically put her on  ciprofloxacin.  CT of the abdomen was done on September 15, 2007, here at  [facility], and this demonstrated a symphysis pubis fluid collection  consistent with abscess involving the abductor muscularis bilaterally  with a fistulous tract to the midline of the abdomen.  Plans were to  initially have the patient go directly from the ER to Interventional  Radiology, have a percutaneous drain placed, and if no other  complications, and if the patient met IO criteria, she was to discharge  home post procedure.   During the initial scout films for the percutaneous procedure, it was  noted with injection of contrast that the patient had a fistulous tract  between the bladder and the skin consistent with a vesicocutaneous  fistula.  We were made aware of this, and it was determined that the  patient needed to be admitted for further evaluation and  neurological  consult.  The patient did undergo aspiration of 7 mL of purulent  material that was mixed with clear fluid.  This clear fluid was positive  for creatinine.   The patient was admitted with the following diagnoses:  1. Vesicocutaneous fistula.  2. Recent recurrent urinary tract infections and hematuria.  3. Recent incision and drainage of suprapubic/symphysis pubis and left      medial thigh necrotizing skin infections.  4. Diabetes.  5. Dismobility secondary to recent ORIF with ankle surgery.  6. History of coronary artery disease, on Plavix.  7. History of atrial fibrillation, on Coumadin.  8. History of seizure disorder, on Dilantin.   HOSPITAL COURSE:  The patient was admitted to the general surgical  floor, where she was placed empirically on ciprofloxacin and Flagyl.  Dr. Retta Diones with Urology services was consulted.  He agreed with our  placement of a Foley catheter for decompression of the bladder and  recommended the patient undergo a cystogram while here.  Two attempts  were made to have the patient undergo this procedure, first  on September 16, 2007, and then on September 17, 2007.  Unfortunately, she still had retained  contrast in the pelvis, which limited the ability to obtain an accurate  cystogram.  Therefore, since the patient was otherwise medically stable,  Dr. Retta Diones recommended she go ahead and go home with a Foley in  place, the leg bag, and follow up with him in 2 weeks.   The patient's lab work was essentially within normal limits.  Since  admission, she did not have a white count.  Now, white count was 5600,  hemoglobin 11.8, and platelets 186,000.  She did have some minor  electrolyte imbalances with mild borderline hyperkalemia and  hypernatremia secondary to hyperglycemia.  Her glucose in triage upon  admission was 365, but all of these normalized except for the sodium  after correction of sugar.  The main issue with the patient was  significant  lower abdominal pain, which was controlled with Percocet  p.o.  The patient would have intermittent tachycardia and presumed runs  of atrial fibrillation, since her heart rate was irregular, but this was  correlated with significant pain, and this improved with administration  of pain medications.  The patient did have urinalysis and culture  obtained.  Urinalysis did show many bacteria and rbc's too numerous to  count, but the culture subsequently had no growth.   On the date of admission, the patient was stable, sitting on the side of  the bed, pain well controlled with regular Percocet use.  Foley catheter  was draining clear yellow urine to the bedside bag.  The patient was  hungry, had tolerated breakfast, and was eager to be discharged, so she  could, as in her words, go to the steak house to eat lunch.   FINAL DISCHARGE DIAGNOSES:  1. Vesicocutaneous fistula.  Workup in progress.  Outpatient per Dr.      Retta Diones.  2. Recent incision and drainage of suprapubic and left medial thigh      abscesses due to necrotizing skin infections.  3. History of atrial fibrillation with intermittent tachycardia due to      pain, otherwise rate controlled.  4. Diabetes mellitus with recent hyperglycemia prior to admission.  5. Hyponatremia secondary to elevated glucose.  6. Hypothyroidism.  7. History of seizure disorder.   DISCHARGE MEDICATIONS:  The patient will resume the following  medications she was on prior to admission:  1. Metoprolol 25 mg daily.  2. Plavix 75 mg daily.  3. Lisinopril 5 mg daily.  4. Singulair 10 mg at bedtime.  5. Dilantin 300 mg at bedtime.  6. Lantus insulin 32 units subcu at bedtime.  7. NovoLog sliding scale insulin subcu at meals.  8. The patient was on ciprofloxacin documented as 500 mg daily.  We      want her to take 500 mg b.i.d., so a new script has been given in      the event the patient does not have enough home medications left at      home.  9.  Percocet 5/325.  She is to continue taking this, and she has also      been given an additional prescription for 5/325 one to two tablets      every 4 hours as needed, #40, dispensed with 0 refills.  10.She is to stop the prior Levaquin and Darvocet.  11.She is to take Flagyl 500 mg t.i.d. for 7 days.   DIET:  Heart-healthy, carb-modified.   ACTIVITY:  Increase activity slowly.  May walk up stairs.  Sponge bath  while catheter in place.   WOUND CARE:  Continue normal saline packing to lower abdominal wound and  left thigh wound as prior to admission twice daily.   Leg bag to catheter.   FOLLOWUP:  1. She needs to follow up with Dr. Janee Morn either as appointment that      has previously been made or to call to be seen in 2 weeks.  2. She is to call Dr. Lenoria Chime office at [phone] to be seen in 2      weeks.      Allison L. Rolene Course      Alfonse Ras, MD  Electronically Signed    ALE/MEDQ  D:  09/17/2007  T:  09/17/2007  Job:  [id number]   cc:   Bertram Millard. Dahlstedt, M.D.  Dr. Janee Morn (unspecified)

## 2010-10-30 NOTE — Consult Note (Signed)
NAMEMELESSIA, KAUS                ACCOUNT NO.:  [id number]   MEDICAL RECORD NO.:  [id number]          PATIENT TYPE:  INP   LOCATION:  5507                         FACILITY:  [facility]   PHYSICIAN:  Bertram Millard. Dahlstedt, M.D.DATE OF BIRTH:  1935-08-18   DATE OF CONSULTATION:  09/15/2007  DATE OF DISCHARGE:                                 CONSULTATION   REASON FOR CONSULTATION:  Possible vesicocutaneous fistula.   BRIEF HISTORY:  This 75 year old female underwent a fairly significant  debridement of a suprapubic abscess in February.  She has gone through  rehab, and is now at home on dressing changes.  She had noted some gross  hematuria over the past few days.  She had a recent CT scan which  revealed a fluid collection in her suprapubic area.  She was to undergo  a CT directed aspiration of this fluid today.  She was given contrast.  Contrast showed up in the suprapubic area and fistula is suspected.  She  is admitted at this time for further evaluation and management.   She has not had any regular history of urinary tract infections.  She  has had some slight dysuria recently.  There has been no problems with  dressing changes, according to the patient.  She does this once herself,  and has a nurse help her at home with this.   PHYSICAL EXAMINATION:  Revealed a 1 cm wide wound in her suprapubic  area.  This was probed and was approximately 3-4 cm deep.  It was  nontender with good granulation tissue.  This was repacked with saline  wet-to-dry.   I reviewed the patient's CT scan done for the aspiration.  There is  contrast extravasated anteriorly in her infrapubic/prevesical space.  Her bladder otherwise appears normal.   IMPRESSION:  Probable vesicocutaneous fistula, relatively uncomplicated  at the present time.   PLAN:  1. At this point, since she has a catheter in, I would leave the      catheter in long term to see if this will assist in fistula closure-      I  bet it  would.  2. We will get a baseline plain cystogram tomorrow to see      approximately where this leak  comes from.  Would recommend getting      serial cystograms about every 2-3 weeks to recheck.  I will      continue to follow this lady.      Bertram Millard. Dahlstedt, M.D.  Electronically Signed     SMD/MEDQ  D:  09/15/2007  T:  09/15/2007  Job:  [id number]

## 2010-10-30 NOTE — H&P (Signed)
NAMEJONIECE, [name]                ACCOUNT NO.:  [id number]   MEDICAL RECORD NO.:  [id number]          PATIENT TYPE:  INP   LOCATION:  5507                         FACILITY:  [facility]   PHYSICIAN:  Revonda Standard L. Rennis Harding, N.P. DATE OF BIRTH:  [birth year]   DATE OF ADMISSION:  09/15/2007  DATE OF DISCHARGE:                              HISTORY & PHYSICAL   CHIEF COMPLAINT:  Pelvic pain.   HISTORY OF PRESENT ILLNESS:  [name] is a 75 year old female patient  with history of diabetes, CAD on Plavix, asthma and hypothyroidism.  She  is status post I&D of left medial thigh suprapubic abscess February 21  by Dr. Violeta Gelinas here at [facility].  She was subsequently discharged on  August 19, 2007 postoperatively to [facility] in  [place].  She has recently been discharged home.  She states she was  on antibiotic therapy post discharge from rehab but has completed those  antibiotics.  Over the past few days the patient has had increasing  pelvic and rectal pressure and pain.  Hematuria has also been noted,  low grade fevers.  She followed up with a primary care physician who  empirically place her on Cipro.  A CT of the abdomen and pelvis was  ordered.  This was done September 15, 2007 here at [facility].  This demonstrated  the symphysis pubis fluid collection consistent with an abscess  involving the abductor muscularis bilaterally with a fistulous tract in  the midline abdomen.  Dr. Colin Benton was the general surgery has reviewed the  CT scan and discussed this with interventional radiology and plans  initially were to have the area percutaneously drained and discharge the  patient home if she otherwise met discharge criteria per interventional  radiology post procedure.   On exam the patient was denying any nausea, vomiting or diet  intolerances and actually states she is very hungry since she has been  n.p.o. since midnight.   The patient subsequently went to interventional radiology  from the ER  with plans to place a percutaneous drain into the abscess/fluid  collection.  Imaging of prior tube place in the drain demonstrated  contrast going into the bladder and then subsequently coming out through  the skin and towards the known abscess.  This is consistent with a  probable vesicocutaneous fistula.  This fluid has subsequently been sent  for creatinine and culture.  Because of these findings the patient will  need to be admitted.   PAST MEDICAL HISTORY:  1. Diabetes on insulin.  2. Gastroesophageal reflux disease.  3. History of hypertension.  4. Hypothyroidism.  5. Seizure disorder.  6. Recent soft tissue infection involving the left medial thigh and      perivesical abscess in the vulva.  7. History of atrial fibrillation not on Coumadin.   PAST SURGICAL HISTORY:  1. Incision and drainage suprapubic area and debridement with incision      and drainage left abductor muscle and debridement for necrotizing      soft tissue infection April 06, 2008.  2. Open reduction and internal fixation left  ankle December 2008.  3. Cerebral aneurysm clipping in 1995.   ALLERGIES:  NKDA.   MEDICATIONS AT HOME:  1. Metoprolol 25 mg daily.  2. Plavix 75 mg daily.  3. Lisinopril 5 mg daily.  4. Singulair 10 mg at hour of sleep.  5. Dilantin 100 mg three caps at bedtime.  6. Lantus 32 units subcu at bedtime.  7. NovoLog sliding scale insulin subcu with every meal.  8. Ciprofloxacin 500 mg daily.  9. Percocet/oxycodone with Tylenol 5/325 one to two tablets every 4-6      hours as needed for pain.  10.Levaquin 500 mg recently completed.  11.Darvocet one every 6 hours as needed.   SOCIAL HISTORY:  The patient lives with daughter.  She does utilize  tobacco products by smoking.  No alcohol.  Seven children.   FAMILY HISTORY:  Noncontributory.   PHYSICAL EXAMINATION:  GENERAL:  Pleasant female patient complaining of  hunger and pelvic pain.  VITAL SIGNS:  Temperature  99.2.  BP 105/71, pulse 112 and regular,  respirations 22.  PSYCHIATRIC:  The patient is alert and oriented x3.  Affect is  appropriate to situation.  NEUROLOGIC:  Cranial nerves II-XII are grossly intact.  The patient was  recently nonambulatory because of the ankle surgery but otherwise moving  all extremities x4.  NECK:  Neck is supple.  Trachea midline.  Thyroid nonpalpable.  CHEST:  Bilateral lung sounds are clear to auscultation.  Respiratory  effort is nonlabored.  She is on room air.  CARDIOVASCULAR:  Heart  sounds are S1 and S2 with the grade 3/6 systolic murmur at the left  sternal border, fourth intercostal space, radiates up towards the  axilla.  She is tachycardic but pulses regular on auscultation.  She is  not on telemetry.  An EKG has not been done.  There is no obvious JVD.  No peripheral edema.  ABDOMEN:  Soft, obese, tender only over the midline prior operative  incision.  Bowel sounds are present.  No obvious hepatosplenomegaly,  masses or bruits are noted.  GENITOURINARY:  The patient has some difficulty with intermittent stress  incontinence symptoms and is wearing a diaper.  RECTAL:  Deferred.  EXTREMITIES:  Symmetrical in appearance.  No cyanosis, no clubbing.  SKIN:  The patient has a lower midline abdominal wound just above the  suprapubic area.  This area measures about 2 x 3 with a depth of 4 cm  with some thick tan-grayish purulence on her dressing.  The wound was  probed.  It does go deeply into the wound as mentioned about 4 cm in  conical type pattern; otherwise the tissues red and granular and healthy  and there is some yellow purulence in the base of this wound.  The left  medial thigh wound is pink and granular and 1.5 x 2 cm x 5 cm in depth.  Packing is in place.  In addition she has lower extremity skin changes  anterior tibialis involving this area in the ankle consistent with a  hemosiderin changes from prior edematous.   LABS AND DIAGNOSTICS:   No labs have been done diagnostics as mentioned  in history present illness regarding PT and INR procedure.   IMPRESSION:  1. Vesicocutaneous fistula with associated subcutaneous abscess.  2. Recent incision and drainage subcutaneous necrotizing soft tissue      infection of the suprapubic area as well as a left medial thigh by      Dr. Janee Morn August 08, 2007.  3. Diabetes on insulin with hyperglycemia.  Glucose was checked in      triage and was 365.  4. Hypothyroidism on replacement therapy.  5. Atrial fibrillation on Coumadin.  6. History of coronary artery disease on Plavix.  7. History of prior cerebral aneurysm clipping with associated seizure      disorder on Dilantin.  8. Ongoing tobacco abuse with asthma, questionable early chronic      obstructive pulmonary disease.  9. Gastroesophageal reflux disease, asymptomatic.  10.Hypertension controlled.   PLAN:  1. The patient will be admitted to the general surgical floor.  2. Urological consult.  Dr. Retta Diones has been contacted.  3. Insert Foley catheter to decompress bladder because of      vesicocutaneous fistula.  Followup on urinalysis and culture.  4. Empiric Cipro and Flagyl IV.  5. Continue home medications.  6. Start modified heart healthy diet.  7. Management of pain with IV and oral medications.  8. Hold Plavix until determination of any potentially invasive      procedures have been clarified.      Allison L. Rennis Harding, N.P.     ALE/MEDQ  D:  09/16/2007  T:  09/16/2007  Job:  [id number]   cc:   Gabrielle Dare. Janee Morn, M.D.  Bertram Millard. Dahlstedt, M.D.

## 2010-10-30 NOTE — Op Note (Signed)
[name], [name]                ACCOUNT NO.:  [id number]   MEDICAL RECORD NO.:  [id number]          PATIENT TYPE:  INP   LOCATION:  2550                         FACILITY:  [facility]   PHYSICIAN:  Gabrielle Dare. Janee Morn, M.D.DATE OF BIRTH:  1935/09/29   DATE OF PROCEDURE:  08/08/2007  DATE OF DISCHARGE:                               OPERATIVE REPORT   PREOPERATIVE DIAGNOSIS:  Necrotizing soft tissue infection in the  suprapubic area and on the left abductor musculature and left medial  thigh.   POSTOPERATIVE DIAGNOSES:  Necrotizing soft tissue infection in the  suprapubic area and on the left abductor musculature and left medial  thigh.   PROCEDURE:  Incision and drainage, suprapubic area, with debridement, as  well as incision and drainage of left adductor musculature with  debridement.  Cultures were taken from both areas.   SURGEON:  Violeta Gelinas, MD   ANESTHESIA:  General.   HISTORY OF PRESENT ILLNESS:  Ms. Beitzel is a 75 year old female with a  history of atrial fibrillation and diabetes mellitus, who has been  treated for a peripubic infection as an outpatient by her primary care  doctor for the past 10 days to 2 weeks with repeated administration of  Rocephin.  She underwent a CT scan of the pelvis last night in the  emergency department at [facility], demonstrating a suspicion of  necrotizing soft tissue infection involving her suprapubic area as well  as some possible myonecrosis of her left adductor muscles in her left  medial thigh. She was admitted by my partner, Dr. Darnell Level, and she  was placed on intravenous antibiotics.  On my examination this morning,  she was very tender just to the right of her suprapubic region as well  as on her high left medial thigh over her adductor muscles.  In light of  her CT findings, I am proceeding with emergent incision and drainage of  both areas in the operating room with further evaluation of the  musculature there as well.   PROCEDURE IN DETAIL:  Informed consent was obtained.  The patient was  identified in the preop holding area.  She is currently receiving  intravenous antibiotics with her regimen now consisting of vancomycin,  Unasyn, clindamycin and Cipro as per Infectious Disease.  She was  brought to the operating room and placed in the lithotomy position after  induction of general endotracheal anesthesia.  Her abdomen and perineum  and both medial thighs were prepped and draped in a sterile fashion.  Attention was first directed to the suprapubic region.  A transverse  incision was made along her skin fold at the area where she was  previously tender.  Subcutaneous tissues were dissected down to the area  just anterior to her pubic symphysis.  We encountered a collection of  cloudy fluid that was not frankly purulent, but was more dishwater type;  this was sent for aerobic and anaerobic cultures.  Further exploration  revealed the musculature in this area to be viable.  Further dissection  was done, demonstrating some very pale and ischemic-looking fatty tissue  just  anterior to the symphysis pubis; this was all debrided away back to  nice viable bleeding tissue and this was sent to Pathology.  Further  exploration of the musculature there revealed the external oblique  fascia to be intact.  The distal rectus sheath and underlying  transversalis musculature was all viable and intact on both sides of the  symphysis.  There was no evidence of either fascial or muscle necrosis  in this area.  No further pockets were noted with a gentle dissection  cephalad and laterally on each side.  Some further scattered ischemic-  looking fatty tissues anterior to the symphysis were debrided;  hemostasis was then ensured.  The area was copiously irrigated and  packed with a saline-soaked gauze.  Attention was redirected down to the  left thigh.  First, the perirectal area was examined closely.  There was  no evidence  of any perirectal abscess, perirectal fluctuance or  induration.  Attention was then turned to the abductor group musculature  at the medial left thigh.  A longitudinal incision was made over these  muscles.  Subcutaneous tissues were dissected down, avoiding some large  and subcutaneous vein that was protected and kept out of the way.  This  revealed the abductor fascia.  This was opened up.  This external  fascial layer was intact.  Underneath, there was another fascial layer  that had some areas of what appeared be patchy necrosis; this was only a  2- or 3-cm section.  This was widely debrided.  The underlying  musculature was minimally debrided, but appeared completely viable.  We  did encounter another large amount of this cloudy fluid, dishwater type,  along the fascial planes in the musculature; this was sent for aerobic  and anaerobic cultures.  Further dissection of the muscles demonstrated  no myonecrosis present.  Some further ischemic-appearing portions of the  fascia were debrided; that was all sent to Pathology.  No frank  myonecrosis was noted.  The area was copiously irrigated.  Meticulous  hemostasis was ensured and the wound was packed with saline-soaked  gauze.  Original sponge, needle and instrument counts were correct at  the completion of the procedure.  The patient tolerated the procedure  well without apparent complication and was taken to the recovery room in  critical, but stable condition with plans to admit her to intensive care  unit.  She will also be further evaluated by Cardiology today.      Gabrielle Dare Janee Morn, M.D.  Electronically Signed     BET/MEDQ  D:  08/08/2007  T:  08/10/2007  Job:  [id number]   cc:   Acey Lav, MD  Ricki Rodriguez, M.D.

## 2010-12-27 ENCOUNTER — Ambulatory Visit: Payer: Self-pay | Admitting: Physician Assistant

## 2011-01-21 ENCOUNTER — Ambulatory Visit: Payer: Self-pay | Admitting: Vascular Surgery

## 2011-02-25 ENCOUNTER — Inpatient Hospital Stay: Payer: Self-pay | Admitting: Vascular Surgery

## 2011-03-11 LAB — DIC (DISSEMINATED INTRAVASCULAR COAGULATION)PANEL
INR: 1.6 — ABNORMAL HIGH
Platelets: 117 — ABNORMAL LOW
Prothrombin Time: 19.2 — ABNORMAL HIGH
Smear Review: NONE SEEN

## 2011-03-11 LAB — DIFFERENTIAL
Basophils Absolute: 0
Basophils Absolute: 0
Basophils Absolute: 0.1
Basophils Absolute: 0
Basophils Absolute: 0
Basophils Relative: 0
Basophils Relative: 0
Basophils Relative: 0
Basophils Relative: 0
Basophils Relative: 0
Eosinophils Absolute: 0.1
Eosinophils Absolute: 0.1
Eosinophils Absolute: 0.1
Eosinophils Relative: 0
Eosinophils Relative: 2
Eosinophils Relative: 2
Eosinophils Relative: 3
Eosinophils Relative: 3
Eosinophils Relative: 2
Lymphocytes Relative: 12
Lymphocytes Relative: 14
Lymphocytes Relative: 17
Lymphocytes Relative: 17
Lymphs Abs: 0.8
Lymphs Abs: 0.8
Lymphs Abs: 1.3
Monocytes Absolute: 0.4
Monocytes Absolute: 0.5
Monocytes Absolute: 0.7
Monocytes Absolute: 0.4
Monocytes Absolute: 0.5
Monocytes Absolute: 0.5
Monocytes Relative: 4
Monocytes Relative: 8
Monocytes Relative: 10
Monocytes Relative: 8
Neutro Abs: 6.6
Neutro Abs: 3.3
Neutro Abs: 4.2
Neutrophils Relative %: 86 — ABNORMAL HIGH
Neutrophils Relative %: 76

## 2011-03-11 LAB — BASIC METABOLIC PANEL
BUN: 5 — ABNORMAL LOW
BUN: 8
BUN: 9
BUN: 3 — ABNORMAL LOW
BUN: 3 — ABNORMAL LOW
CO2: 22
CO2: 24
CO2: 28
CO2: 28
CO2: 30
Calcium: 7.2 — ABNORMAL LOW
Calcium: 7.3 — ABNORMAL LOW
Calcium: 7.6 — ABNORMAL LOW
Calcium: 7.9 — ABNORMAL LOW
Calcium: 7.7 — ABNORMAL LOW
Calcium: 7.7 — ABNORMAL LOW
Calcium: 7.8 — ABNORMAL LOW
Chloride: 101
Creatinine, Ser: 0.77
Creatinine, Ser: 0.64
GFR calc Af Amer: >60
GFR calc Af Amer: >60
GFR calc Af Amer: >60
GFR calc Af Amer: >60
GFR calc Af Amer: >60
GFR calc Af Amer: >60
GFR calc non Af Amer: 58 — ABNORMAL LOW
GFR calc non Af Amer: 60 — ABNORMAL LOW
GFR calc non Af Amer: >60
GFR calc non Af Amer: >60
GFR calc non Af Amer: >60
GFR calc non Af Amer: >60
GFR calc non Af Amer: >60
Glucose, Bld: 111 — ABNORMAL HIGH
Glucose, Bld: 127 — ABNORMAL HIGH
Glucose, Bld: 128 — ABNORMAL HIGH
Glucose, Bld: 202 — ABNORMAL HIGH
Glucose, Bld: 97
Glucose, Bld: 202 — ABNORMAL HIGH
Glucose, Bld: 256 — ABNORMAL HIGH
Potassium: 3.4 — ABNORMAL LOW
Potassium: 3.6
Potassium: 4
Potassium: 4.1
Potassium: 3.1 — ABNORMAL LOW
Potassium: 4
Potassium: 4.1
Sodium: 135
Sodium: 135
Sodium: 135
Sodium: 138
Sodium: 136
Sodium: 137
Sodium: 140

## 2011-03-11 LAB — CULTURE, ROUTINE-ABSCESS
Culture: NO GROWTH
Culture: NO GROWTH
Culture: NO GROWTH

## 2011-03-11 LAB — POCT I-STAT 3, ART BLOOD GAS (G3+)
Acid-base deficit: 1
Acid-base deficit: 3 — ABNORMAL HIGH
Acid-base deficit: 4 — ABNORMAL HIGH
Bicarbonate: 20.7
Bicarbonate: 21.3
Bicarbonate: 21.7
Bicarbonate: 22.6
Bicarbonate: 23.2
O2 Saturation: 85
O2 Saturation: 95
O2 Saturation: 97
Operator id: 236041
Operator id: 236041
Operator id: 276051
Patient temperature: 101.8
Patient temperature: 102.9
TCO2: 19
TCO2: 24
pCO2 arterial: 28.5 — ABNORMAL LOW
pCO2 arterial: 34.8 — ABNORMAL LOW
pCO2 arterial: 37
pH, Arterial: 7.212 — ABNORMAL LOW
pH, Arterial: 7.352
pH, Arterial: 7.363
pO2, Arterial: 140 — ABNORMAL HIGH
pO2, Arterial: 286 — ABNORMAL HIGH
pO2, Arterial: 50 — ABNORMAL LOW
pO2, Arterial: 83

## 2011-03-11 LAB — PROTIME-INR
INR: 1.6 — ABNORMAL HIGH
Prothrombin Time: 15.6 — ABNORMAL HIGH
Prothrombin Time: 16.1 — ABNORMAL HIGH
Prothrombin Time: 15.1

## 2011-03-11 LAB — CBC
HCT: 24.5 — ABNORMAL LOW
HCT: 26.2 — ABNORMAL LOW
HCT: 30.2 — ABNORMAL LOW
HCT: 33.7 — ABNORMAL LOW
HCT: 32.3 — ABNORMAL LOW
HCT: 33.4 — ABNORMAL LOW
HCT: 37
HCT: 34.8 — ABNORMAL LOW
Hemoglobin: 10 — ABNORMAL LOW
Hemoglobin: 8.2 — ABNORMAL LOW
Hemoglobin: 8.8 — ABNORMAL LOW
Hemoglobin: 8.8 — ABNORMAL LOW
Hemoglobin: 9.6 — ABNORMAL LOW
Hemoglobin: 10.8 — ABNORMAL LOW
Hemoglobin: 11.1 — ABNORMAL LOW
Hemoglobin: 12.4
MCHC: 32.3
MCHC: 33.2
MCHC: 33.4
MCHC: 33.6
MCHC: 33.6
MCHC: 33.6
MCHC: 33.3
MCHC: 33.5
MCV: 93.7
MCV: 95.7
MCV: 93
MCV: 91.9
Platelets: 125 — ABNORMAL LOW
Platelets: 128 — ABNORMAL LOW
Platelets: 131 — ABNORMAL LOW
Platelets: 135 — ABNORMAL LOW
Platelets: 158
Platelets: 176
Platelets: 147 — ABNORMAL LOW
Platelets: 151
Platelets: 186
RBC: 2.81 — ABNORMAL LOW
RBC: 3.08 — ABNORMAL LOW
RBC: 3.11 — ABNORMAL LOW
RBC: 3.23 — ABNORMAL LOW
RBC: 3.57 — ABNORMAL LOW
RBC: 3.74 — ABNORMAL LOW
RDW: 15.3
RDW: 15.6 — ABNORMAL HIGH
RDW: 15.6 — ABNORMAL HIGH
RDW: 15.8 — ABNORMAL HIGH
RDW: 16.5 — ABNORMAL HIGH
RDW: 17.1 — ABNORMAL HIGH
RDW: 16.6 — ABNORMAL HIGH
RDW: 16.8 — ABNORMAL HIGH
WBC: 13.6 — ABNORMAL HIGH
WBC: 16.3 — ABNORMAL HIGH
WBC: 7.7
WBC: 8.8
WBC: 4.9
WBC: 5.5

## 2011-03-11 LAB — COMPREHENSIVE METABOLIC PANEL
ALT: 16
ALT: 27
ALT: 33
ALT: 8
AST: 27
AST: 28
AST: 42 — ABNORMAL HIGH
AST: 72 — ABNORMAL HIGH
AST: 16
Albumin: 1.6 — ABNORMAL LOW
Albumin: 1.9 — ABNORMAL LOW
Albumin: 2.1 — ABNORMAL LOW
Alkaline Phosphatase: 175 — ABNORMAL HIGH
Alkaline Phosphatase: 179 — ABNORMAL HIGH
Alkaline Phosphatase: 196 — ABNORMAL HIGH
Alkaline Phosphatase: 311 — ABNORMAL HIGH
Alkaline Phosphatase: 346 — ABNORMAL HIGH
Alkaline Phosphatase: 158 — ABNORMAL HIGH
BUN: 15
BUN: 16
CO2: 14 — ABNORMAL LOW
CO2: 15 — ABNORMAL LOW
CO2: 19
CO2: 24
Calcium: 7.5 — ABNORMAL LOW
Chloride: 104
Chloride: 107
Chloride: 107
Chloride: 94 — ABNORMAL LOW
Chloride: 98
Chloride: 99
Creatinine, Ser: 1.22 — ABNORMAL HIGH
Creatinine, Ser: 1.01
GFR calc Af Amer: 53 — ABNORMAL LOW
GFR calc Af Amer: >60
GFR calc Af Amer: >60
GFR calc Af Amer: >60
GFR calc Af Amer: >60
GFR calc non Af Amer: 44 — ABNORMAL LOW
GFR calc non Af Amer: 48 — ABNORMAL LOW
GFR calc non Af Amer: 50 — ABNORMAL LOW
GFR calc non Af Amer: 54 — ABNORMAL LOW
Glucose, Bld: 257 — ABNORMAL HIGH
Potassium: 3.3 — ABNORMAL LOW
Potassium: 3.5
Potassium: 4.2
Potassium: 4.9
Potassium: 5.3 — ABNORMAL HIGH
Potassium: 5.4 — ABNORMAL HIGH
Sodium: 125 — ABNORMAL LOW
Sodium: 132 — ABNORMAL LOW
Sodium: 136
Total Bilirubin: 1.1
Total Bilirubin: 1.5 — ABNORMAL HIGH
Total Bilirubin: 1.6 — ABNORMAL HIGH
Total Bilirubin: 0.6
Total Protein: 5.2 — ABNORMAL LOW
Total Protein: 6.5
Total Protein: 6.7

## 2011-03-11 LAB — CARDIAC PANEL(CRET KIN+CKTOT+MB+TROPI)
CK, MB: 2
CK, MB: 2.2
CK, MB: 2.3
CK, MB: 2.5
CK, MB: 3.8
CK, MB: 4
CK, MB: 4.2 — ABNORMAL HIGH
Relative Index: 0.5
Relative Index: 0.9
Relative Index: 1.4
Relative Index: 2.6 — ABNORMAL HIGH
Relative Index: 2.8 — ABNORMAL HIGH
Relative Index: 2.9 — ABNORMAL HIGH
Relative Index: 3.2 — ABNORMAL HIGH
Total CK: 111
Total CK: 123
Total CK: 136
Total CK: 140
Total CK: 197 — ABNORMAL HIGH
Total CK: 199 — ABNORMAL HIGH
Total CK: 269 — ABNORMAL HIGH
Total CK: 495 — ABNORMAL HIGH
Troponin I: 0.03
Troponin I: 0.04
Troponin I: 0.04
Troponin I: 0.05
Troponin I: 0.05
Troponin I: 0.08 — ABNORMAL HIGH

## 2011-03-11 LAB — CULTURE, BLOOD (ROUTINE X 2)
Culture: NO GROWTH
Culture: NO GROWTH

## 2011-03-11 LAB — BLOOD GAS, ARTERIAL
Drawn by: 266171
FIO2: 28
O2 Saturation: 97.1
pCO2 arterial: 29.2 — ABNORMAL LOW
pH, Arterial: 7.323 — ABNORMAL LOW
pO2, Arterial: 97.6

## 2011-03-11 LAB — CROSSMATCH: Antibody Screen: NEGATIVE

## 2011-03-11 LAB — CARBOXYHEMOGLOBIN
Carboxyhemoglobin: 1.7 — ABNORMAL HIGH
Methemoglobin: 0.9
Total hemoglobin: 11.4 — ABNORMAL LOW

## 2011-03-11 LAB — POCT I-STAT 7, (LYTES, BLD GAS, ICA,H+H)
Calcium, Ion: 1.12
HCT: 24 — ABNORMAL LOW
O2 Saturation: 99
Operator id: 236041
Patient temperature: 101.8
Potassium: 4.5
pCO2 arterial: 38.7
pH, Arterial: 7.36
pO2, Arterial: 143 — ABNORMAL HIGH

## 2011-03-11 LAB — ANAEROBIC CULTURE: Gram Stain: NONE SEEN

## 2011-03-11 LAB — URINE MICROSCOPIC-ADD ON

## 2011-03-11 LAB — URINALYSIS, ROUTINE W REFLEX MICROSCOPIC
Bilirubin Urine: NEGATIVE
Ketones, ur: NEGATIVE
Ketones, ur: NEGATIVE
Nitrite: NEGATIVE
Nitrite: NEGATIVE
Nitrite: NEGATIVE
Protein, ur: 100 — AB
Protein, ur: 30 — AB
Specific Gravity, Urine: 1.025
Specific Gravity, Urine: >1.046 — ABNORMAL HIGH
Specific Gravity, Urine: >1.046 — ABNORMAL HIGH
Urobilinogen, UA: 1
Urobilinogen, UA: 2 — ABNORMAL HIGH
Urobilinogen, UA: 0.2

## 2011-03-11 LAB — URINE CULTURE
Colony Count: NO GROWTH
Colony Count: NO GROWTH
Culture: NO GROWTH

## 2011-03-11 LAB — B-NATRIURETIC PEPTIDE (CONVERTED LAB): Pro B Natriuretic peptide (BNP): 989 — ABNORMAL HIGH

## 2011-03-11 LAB — POCT I-STAT 3, VENOUS BLOOD GAS (G3P V)
Bicarbonate: 24.1 — ABNORMAL HIGH
O2 Saturation: 58
TCO2: 25
pCO2, Ven: 37.9 — ABNORMAL LOW
pO2, Ven: 30

## 2011-03-11 LAB — D-DIMER, QUANTITATIVE: D-Dimer, Quant: 3.31 — ABNORMAL HIGH

## 2011-03-11 LAB — MAGNESIUM: Magnesium: 2.1

## 2011-03-11 LAB — ABO/RH: ABO/RH(D): B POS

## 2011-03-11 LAB — CREATININE, FLUID (PLEURAL, PERITONEAL, JP DRAINAGE): Creat, Fluid: 16.2

## 2011-03-11 LAB — LACTIC ACID, PLASMA: Lactic Acid, Venous: 1.4

## 2011-03-11 LAB — PHOSPHORUS: Phosphorus: 2.9

## 2011-03-12 LAB — BASIC METABOLIC PANEL
BUN: 9
CO2: 23
CO2: 23
Calcium: 8.4
GFR calc Af Amer: >60
GFR calc non Af Amer: >60
Glucose, Bld: 116 — ABNORMAL HIGH
Glucose, Bld: 205 — ABNORMAL HIGH
Potassium: 4.6
Potassium: 5.1
Sodium: 133 — ABNORMAL LOW

## 2011-03-12 LAB — CBC
HCT: 32.2 — ABNORMAL LOW
HCT: 33.1 — ABNORMAL LOW
Hemoglobin: 11.1 — ABNORMAL LOW
Hemoglobin: 11.1 — ABNORMAL LOW
MCHC: 33.7
MCHC: 34.3
Platelets: 159
RBC: 3.58 — ABNORMAL LOW
RDW: 17 — ABNORMAL HIGH
RDW: 17 — ABNORMAL HIGH

## 2011-04-28 IMAGING — CR DG CHEST 1V PORT
1 series · 1 of 1 positions shown · non-contrast
Comparison: none

REASON FOR EXAM: low O2 sats
COMMENTS:

[view not recorded]
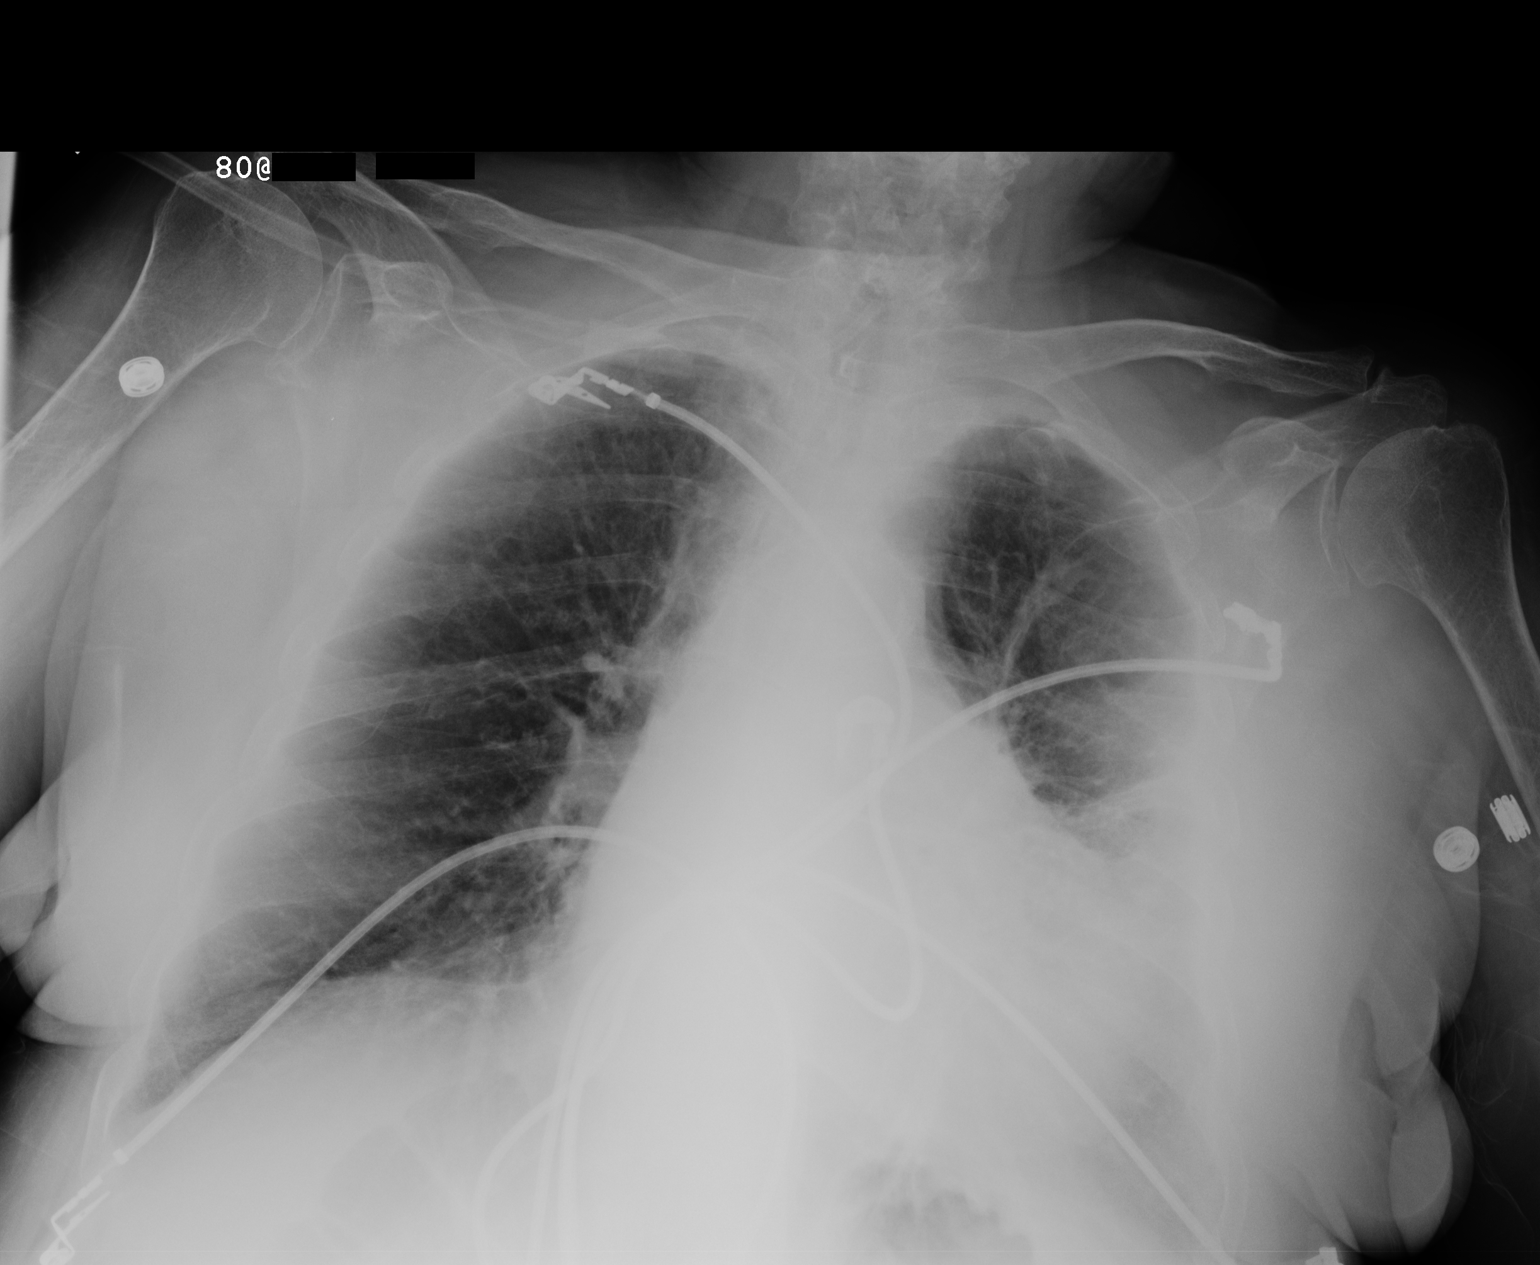

[1 of 1 positions shown; findings below may reference images not displayed]

PROCEDURE:     DXR - DXR PORTABLE CHEST SINGLE VIEW  - June 22, 2009  [DATE]

RESULT:     Comparison is made to the exam of 06/21/2009.

There is persistent increased density in the mid to lower left lung. There
is mild pulmonary vascular congestion. There is no right effusion or
significant right-sided infiltrate. Monitoring electrodes are present. The
heart size is difficult to evaluate.
IMPRESSION: Left lung base atelectasis or infiltrate possibly with
effusion. The heart appears to be enlarged on previous images and may also
be obscuring the left lung base.

## 2011-11-20 ENCOUNTER — Ambulatory Visit: Payer: Self-pay | Admitting: Vascular Surgery

## 2011-11-20 LAB — BASIC METABOLIC PANEL
Anion Gap: 5 — ABNORMAL LOW (ref 7–16)
Calcium, Total: 8.7 mg/dL (ref 8.5–10.1)
Chloride: 110 mmol/L — ABNORMAL HIGH (ref 98–107)
Creatinine: 1.91 mg/dL — ABNORMAL HIGH (ref 0.60–1.30)
EGFR (African American): 30 — ABNORMAL LOW
EGFR (Non-African Amer.): 26 — ABNORMAL LOW
Glucose: 165 mg/dL — ABNORMAL HIGH (ref 65–99)
Potassium: 5.2 mmol/L — ABNORMAL HIGH (ref 3.5–5.1)
Sodium: 142 mmol/L (ref 136–145)

## 2012-01-23 ENCOUNTER — Ambulatory Visit: Payer: Self-pay | Admitting: Physician Assistant

## 2012-02-03 ENCOUNTER — Ambulatory Visit: Payer: Self-pay | Admitting: Physician Assistant

## 2012-07-21 ENCOUNTER — Other Ambulatory Visit: Payer: Self-pay | Admitting: Physician Assistant

## 2012-07-21 LAB — BASIC METABOLIC PANEL
Anion Gap: 7 (ref 7–16)
BUN: 38 mg/dL — ABNORMAL HIGH (ref 7–18)
Calcium, Total: 8.6 mg/dL (ref 8.5–10.1)
Co2: 21 mmol/L (ref 21–32)
Creatinine: 2.42 mg/dL — ABNORMAL HIGH (ref 0.60–1.30)
EGFR (African American): 22 — ABNORMAL LOW
EGFR (Non-African Amer.): 19 — ABNORMAL LOW
Potassium: 6.1 mmol/L — ABNORMAL HIGH (ref 3.5–5.1)

## 2012-12-21 ENCOUNTER — Inpatient Hospital Stay: Payer: Self-pay | Admitting: Student

## 2012-12-21 LAB — COMPREHENSIVE METABOLIC PANEL
Albumin: 3.3 g/dL — ABNORMAL LOW (ref 3.4–5.0)
Alkaline Phosphatase: 75 U/L (ref 50–136)
Anion Gap: 10 (ref 7–16)
BUN: 47 mg/dL — ABNORMAL HIGH (ref 7–18)
Creatinine: 2.76 mg/dL — ABNORMAL HIGH (ref 0.60–1.30)
EGFR (African American): 19 — ABNORMAL LOW
EGFR (Non-African Amer.): 16 — ABNORMAL LOW
Glucose: 391 mg/dL — ABNORMAL HIGH (ref 65–99)
Total Protein: 7 g/dL (ref 6.4–8.2)

## 2012-12-21 LAB — CBC
HCT: 35.9 % (ref 35.0–47.0)
HGB: 11.8 g/dL — ABNORMAL LOW (ref 12.0–16.0)
MCH: 31.7 pg (ref 26.0–34.0)
MCHC: 33 g/dL (ref 32.0–36.0)
MCV: 96 fL (ref 80–100)
Platelet: 67 10*3/uL — ABNORMAL LOW (ref 150–440)
RDW: 15 % — ABNORMAL HIGH (ref 11.5–14.5)

## 2012-12-21 LAB — TROPONIN I
Troponin-I: 1.8 ng/mL — ABNORMAL HIGH
Troponin-I: 13.62 ng/mL — ABNORMAL HIGH

## 2012-12-21 LAB — APTT: Activated PTT: 51.6 secs — ABNORMAL HIGH (ref 23.6–35.9)

## 2012-12-21 LAB — PROTIME-INR: Prothrombin Time: 14.6 secs (ref 11.5–14.7)

## 2012-12-21 LAB — CK TOTAL AND CKMB (NOT AT ARMC): CK-MB: 9.8 ng/mL — ABNORMAL HIGH (ref 0.5–3.6)

## 2012-12-22 LAB — CBC WITH DIFFERENTIAL/PLATELET
Basophil #: 0 10*3/uL (ref 0.0–0.1)
Eosinophil #: 0.2 10*3/uL (ref 0.0–0.7)
HGB: 11.1 g/dL — ABNORMAL LOW (ref 12.0–16.0)
MCV: 93 fL (ref 80–100)
RBC: 3.51 10*6/uL — ABNORMAL LOW (ref 3.80–5.20)

## 2012-12-22 LAB — BASIC METABOLIC PANEL
Anion Gap: 7 (ref 7–16)
BUN: 33 mg/dL — ABNORMAL HIGH (ref 7–18)
Calcium, Total: 8 mg/dL — ABNORMAL LOW (ref 8.5–10.1)
Chloride: 110 mmol/L — ABNORMAL HIGH (ref 98–107)
EGFR (African American): 27 — ABNORMAL LOW
EGFR (Non-African Amer.): 23 — ABNORMAL LOW
Osmolality: 287 (ref 275–301)

## 2012-12-22 LAB — LIPID PANEL
HDL Cholesterol: 26 mg/dL — ABNORMAL LOW (ref 40–60)
Ldl Cholesterol, Calc: 59 mg/dL (ref 0–100)
Triglycerides: 163 mg/dL (ref 0–200)
VLDL Cholesterol, Calc: 33 mg/dL (ref 5–40)

## 2012-12-22 LAB — MAGNESIUM: Magnesium: 2.1 mg/dL

## 2012-12-22 LAB — HEMOGLOBIN A1C: Hemoglobin A1C: 7.9 % — ABNORMAL HIGH (ref 4.2–6.3)

## 2012-12-22 LAB — APTT
Activated PTT: 65.4 secs — ABNORMAL HIGH (ref 23.6–35.9)
Activated PTT: 34.9 secs (ref 23.6–35.9)

## 2012-12-22 LAB — TROPONIN I: Troponin-I: 6.1 ng/mL — ABNORMAL HIGH

## 2012-12-22 LAB — CK TOTAL AND CKMB (NOT AT ARMC): CK-MB: 14.7 ng/mL — ABNORMAL HIGH (ref 0.5–3.6)

## 2013-02-15 DEATH — deceased

## 2014-10-07 NOTE — Consult Note (Signed)
PATIENT NAME:  [name], [name] MR#:  [id number] DATE OF BIRTH:  [birth year]  DATE OF CONSULTATION:  12/21/2012  REFERRING PHYSICIAN:  Dr. Jacques NavyAhmadzia. CONSULTING PHYSICIAN:  Julie MillardAlexander Darnell Jeschke, [name]  PRIMARY CARE PHYSICIAN: Dr. Anna Genreonroy.   PRIMARY CARIOLOGIST: Dr. Juliann Paresallwood.  PRIMARY NEPHROLOGIST: Dr. Thedore MinsSingh.   CHIEF COMPLAINT: Chest pain.   REASON FOR CONSULTATION: Consultation requested for evaluation of chest pain and elevated troponin.   HISTORY OF PRESENT ILLNESS: The patient is a 79 year old female with history of coronary artery disease, status post prior myocardial infarction, chronic kidney disease and peripheral vascular disease. The patient presents to [facility] Room via EMS after experiencing left-sided chest discomfort with radiation to her left arm. She presented to [facility] where EKG was nondiagnostic. Initial troponin was elevated to 1.8.  Admission labs also included an elevated BUN and creatinine of 47 and 2.76, respectively. The patient currently denies chest pain.   PAST MEDICAL HISTORY:  1.  Status post myocardial infarction.  2.  Chronic atrial fibrillation.  3.  Congestive heart failure.  4.  Peripheral vascular disease, status post prior iliac stents by Dr. Wyn Quakerew. 5.  Hypertension. 6.  Seizure disorder.  7.  Hypothyroidism.  8.  Tobacco abuse.   MEDICATIONS ON ADMISSION: Aspirin 325 mg daily, clopidogrel 75 mg daily, Digoxin 0.125 mg daily. Furosemide 20 mg daily, Lantus 55 units q.a.m., levothyroxine 0.125 mg daily, lisinopril 5 mg daily, lovastatin 40 mg daily. montelukast 10 mg daily and NovoLog sliding scale.   SOCIAL HISTORY: The patient has smoked up to 3 to 4 packs of cigarettes a day, currently smoking a pack of cigarettes a day. She currently lives with her daughter.   FAMILY HISTORY: No immediate family history of coronary artery disease or myocardial infarction.   REVIEW OF SYSTEMS:  CONSTITUTIONAL: No fever or chills. EYES: No blurry vision.   EARS: No hearing loss. RESPIRATORY: No shortness of breath. CARDIOVASCULAR: Chest pain as described above. GASTROINTESTINAL: No nausea, vomiting, or diarrhea. GENITOURINARY: No dysuria or hematuria. ENDOCRINE: No polyuria or polydipsia. MUSCULOSKELETAL: No arthralgias or myalgias. NEUROLOGICAL: No focal or muscle weakness or numbness. PSYCHOLOGICAL: No depression or anxiety.   PHYSICAL EXAMINATION:  VITAL SIGNS: Blood pressure 136/75, pulse 66, respirations 20, temperature 97.8, pulse oximetry 93%.  HEENT: Pupils equal and reactive to light and accommodation.  NECK: Supple without thyromegaly.  LUNGS: Decreased breath sounds in both lung fields.  CARDIOVASCULAR: Normal JVP. Normal PMI. Regular rate and rhythm. Normal S1, S2. No appreciable gallop, murmur, or rub.  ABDOMEN: Soft and nontender. Pulses were diminished bilaterally.  MUSCULOSKELETAL: Normal muscle tone.  NEUROLOGIC: The patient is alert and oriented x 3. Motor and sensory both grossly intact.   IMPRESSION: A 79 year old female, who presents with chest pain, has mildly elevated troponin. Chest pain now resolved. The patient is currently on heparin drip. The patient has significant chronic kidney disease and would be a high risk for contrast-induced nephrotoxicity and possible renal failure.   RECOMMENDATIONS:  1.  I agree with current therapy.  2.  Would continue heparin for 48 to 72 hours. 3.  Await nephrology consult.  4.  Continue to cycle cardiac enzymes.  5.  Review 2D echocardiogram.  6.  Will discuss possible cardiac catheterization pending repeat serial cardiac biomarkers and nephrology consult. If the patient remains clinically stable without any further chest pain, may pursue conservative management. We will discuss this further with Dr. Juliann Paresallwood her primary cardiologist. ____________________________ Julie MillardAlexander Marcello Tuzzolino, [name] ap:aw D: 12/21/2012 14:12:16 ET T:  12/21/2012 14:20:38 ET JOB#: [id number]  cc: Julie Millard, [name], <Dictator> Julie Millard [name] ELECTRONICALLY SIGNED 01/05/2013 12:29

## 2014-10-07 NOTE — Consult Note (Signed)
Brief Consult Note: Diagnosis: CP, elevated troponin, probable NSTEMI, CKD, high risk for contrast induced nephrotoxicity.   Patient was seen by consultant.   Consult note dictated.   Comments: REC  Agree with current therapy, cont heparin drip 48-72h, await Renal consult, review echo, consider cardiac cath pending repeat trop, renal consult.  Electronic Signatures: Marcina MillardParaschos, Ameliarose Shark (MD)  (Signed 07-Jul-14 14:14)  Authored: Brief Consult Note   Last Updated: 07-Jul-14 14:14 by Marcina MillardParaschos, Saroya Riccobono (MD)

## 2014-10-07 NOTE — H&P (Signed)
PATIENT NAME:  [name], DUERR MR#:  [id number] DATE OF BIRTH:  1935/08/10  DATE OF ADMISSION:  12/21/2012  REFERRING PHYSICIAN: Dr. Mayford Knife.   PRIMARY CARE PHYSICIAN: Dr. Anna Genre.   PRIMARY CARDIOLOGIST: Dr. Juliann Pares.  PRIMARY NEPHROLOGIST: At [facility].   CHIEF COMPLAINT: Chest pain.   HISTORY OF PRESENT ILLNESS: The patient is a pleasant 79 year old Caucasian female with CKD, stage III versus IV, diabetes. She has CAD, status post MI and CHF. She has extensive peripheral vascular disease and was to undergo an evaluation by Dr. Wyn Quaker today; however, came into the hospital via EMS after experiencing left-sided chest pain shooting to the arm. She also had increased dyspnea on exertion than prior. She does have dyspnea exertion at baseline, but felt short of breath. EMS was called. She was given aspirin and the pain resolved by the time she was here. She was also given nitroglycerin. Here, she was found to have elevated troponin as well as CK-MB. Troponin was 1.8 and CK-MB was 9.8 with CK total being within normal limits. Furthermore, she had increased ST depressions in her baseline in V3 to V6. Hospitalist services were contacted for further evaluation and management.   PAST MEDICAL HISTORY: Atrial fibrillation, not on Coumadin, known history of CAD, status post MI, CHF, unknown if it is systolic or diastolic, insulin-dependent diabetes mellitus, extensive peripheral arterial disease, hypertension, history of seizure disorder, previously on Dilantin, but not any longer, history of cerebral aneurysm, ongoing tobacco abuse, hypothyroidism and macular degeneration. She has previous necrotizing fasciitis requiring surgery. History of angioplasties to both lower extremities for PAD. Left ankle and knee surgery. Status post right hip surgery. She had a craniotomy due to cerebral aneurysm in the past.   ALLERGIES: No known drug allergies.   FAMILY HISTORY: Diabetes and hypertension in aunt.    SOCIAL HISTORY: Smokes a pack a day since age 40. Previously smoked more than a pack and has tried to cut back. No alcohol. No drugs. Lives with her daughter.   OUTPATIENT MEDICATIONS: Aspirin 325 mg daily, clopidogrel 75 mg daily, digoxin 125 mcg daily, furosemide 20 mg daily, Lantus 55 units once a day in the morning, but she states it was 50 units, levothyroxine 125 mg daily, lisinopril 5 mg daily, lovastatin 40 mg daily, montelukast 10 mg daily, NovoLog sliding scale, senna plus 1 tab 2 times a day.   REVIEW OF SYSTEMS: CONSTITUTIONAL: No weight changes. Has chronic headaches. No fevers. EYES: Has macular degeneration and decreased vision in both eyes. ENT: No tinnitus or hearing loss. No sore throat. She has occasional runny nose. RESPIRATORY: No cough. Has dyspnea on exertion. No shortness of breath now. No painful respirations. CARDIOVASCULAR: Chest pain as above, which has resolved now. No orthopnea. No swelling in the legs. Has history of atrial fibrillation, congestive heart failure and MI.  GASTROINTESTINAL: No nausea, vomiting, diarrhea, abdominal pain, rectal bleeding, ulcers or GERD. GENITOURINARY: Denies dysuria or hematuria. HEMATOLOGIC/LYMPHATIC: Denies anemia. Has easy bruising. SKIN: No new rashes. MUSCULOSKELETAL: Has right upper extremity muscle pains. NEUROLOGIC: No focal weakness or numbness. Has history of seizure, though was 1 per family in the setting of high blood sugars. PSYCHIATRIC: Denies anxiety or insomnia.   PHYSICAL EXAMINATION:  VITAL SIGNS: Temperature on arrival 98.4, pulse rate 97, respiratory rate 20, blood pressure 118/72 and pulse oximetry was 93% on room air.  GENERAL: The patient is an elderly, Caucasian female sitting in bed.  HEENT: Normocephalic, atraumatic. Pupils are equal and reactive. Dry mucous membranes.  NECK: Supple. No thyroid tenderness. No cervical lymphadenopathy.  CARDIOVASCULAR: Irregularly irregular. No murmurs, rubs, or gallops.  LUNGS:  Clear to auscultation without wheezing, rhonchi or rales.  ABDOMEN: Soft, nontender, nondistended. Positive bowel sounds in all quadrants.  EXTREMITIES: No significant lower extremity edema. On examination of the skin, the patient has upper extremity as well as lower extremity ecchymosis and some bruising. NEUROLOGIC: Cranial nerves appear to be intact II through VII. Strength is 5 out of 5 in all extremities.  PSYCHIATRIC: Awake, alert, oriented, pleasant and cooperative.   LABS: WBC 4.8, hemoglobin 11.8, platelets are 67. Troponin I 1.8, CK-MB 9.8, CK total 104. LFTs: Albumin is 3.3, otherwise within normal limits. Glucose on arrival was 391, BUN 47, creatinine 2.76. Of note, creatinine was 2.42 in February with BUN of 38, potassium 4.9, serum CO2 20 and GFR of 16. EKG shows atrial fibrillation, Q waves in V1 and V2, which appears to be old. There are also ST depressions in multiple leads, many of them old as well, but they are more pronounced on this EKG and there are new ST depressions in V3, which was not on the EKG in 2011 with increased ST depressions in V4, V5 and V6 than the prior EKG. X-ray of the chest shows improved aeration in the left lung base with persistent density with additional length and appeared to be worsened infrahilar right lung interstitial thickening diffusely. There is patchy density lateral in the left upper lobe. It is read as bilateral mid and lower lung base pneumonia.   ASSESSMENT AND PLAN: We have a 79 year old heavy smoker with history of severe peripheral arterial disease and coronary artery disease, status post myocardial infarction, congestive heart failure, chronic kidney disease, atrial fibrillation and diabetes, who presents with an episode of chest pain, which is now resolved with typical features of shooting to the arm getting better with nitroglycerin and aspirin with EKG changes and positive troponin. The patient is having an acute non-ST elevation myocardial  infarction. Would admit the patient to telemetry and continue the patient on heparin drip, which has been started. The case will be discussed with Dr. Juliann Pares, who is already aware of the patient. Continue the aspirin and the Plavix, statin and add a low-dose beta blocker. She is not on one. Cycle the troponins and obtain an echocardiogram. Given the low GFR, I do not know if she is an ideal candidate for a cath, but would see what cardiology wants to do. We would also obtain a nephrology consult in anticipation for possible cath. We would monitor her vitals. Would monitor her thrombocytopenia given that she will be on a heparin drip. It is unclear if she has acute on chronic chronic kidney disease stage III as a baseline versus chronic stable chronic kidney disease, stage IV. We will see what her nephrology says. We will continue the Lantus, add a sliding scale insulin, check a hemoglobin A1c, lipid profile and a TSH. In regards to the atrial fibrillation, it is rate-controlled. She is not on Coumadin as an outpatient. Here, currently she is on a heparin drip. I would continue digoxin and check a digoxin level and add a beta blocker. I would also add nitroglycerin patch as well as morphine p.r.n. She was counseled for 3 minutes about her tobacco abuse. She does not want a patch and she says she will try to kick the habit by herself. New onset x-ray findings. She is not short of breath, more so than her baseline currently. She  has had no fever or hypoxia. It is possible that those infiltrate are fluid. Would obtain another x-ray of the chest in the morning for better evaluation; a PA and lateral this time. We would obtain an echocardiogram as well for the heart.   CRITICAL CARE TIME SPENT: 50 minutes.   ____________________________ Krystal EatonShayiq Sylvania Moss, MD sa:aw D: 12/21/2012 10:06:50 ET T: 12/21/2012 10:21:42 ET JOB#: [id number]  cc: Krystal EatonShayiq Ashaunti Treptow, MD, <Dictator> Dwayne D. Juliann Paresallwood, MD Krystal EatonSHAYIQ Lilyanna Lunt  MD ELECTRONICALLY SIGNED 01/11/2013 14:33

## 2014-10-07 NOTE — Discharge Summary (Signed)
PATIENT NAME:  [name], [name] MR#:  [id number] DATE OF BIRTH:  Aug 30, 1935  DATE OF ADMISSION:  12/21/2012 DATE OF TRANSFER:  12/22/2012  DISPOSITION: [facility] on 12/22/2012.   CONSULTANTS: Dr. Cherylann RatelLateef from nephrology. Dr. Darrold JunkerParaschos from cardiology.   PRIMARY CARE PHYSICIAN: Lonie PeakNathan Conroy, MD   CHIEF COMPLAINT: Chest pain.   DIAGNOSES AT THE TIME OF TRANSFER:  1. Non-ST elevation myocardial infarction, multivessel coronary artery disease, [facility].  2. Acute on chronic renal failure.  3. Diabetes.  4. Ongoing tobacco abuse.  5. Hyperlipidemia.  6. Severe peripheral arterial disease.  7. History of chronic systolic congestive heart failure, last ejection fraction of 45% to 50%.  8. Atrial fibrillation.  9. Hypertension.  10. History of seizure disorder, no longer on any antiepileptics.  11. History of cerebral aneurysm.  12. Hypothyroidism.  13. History of macular degeneration.  14. History of necrotizing fasciitis.   MEDICATIONS AT THE TIME OF TRANSFER:  1. Acetylcysteine 1200 mg q.12 hours.  2. Clonidine 0.2 mg b.i.d. 3. Digoxin 125 mcg daily.  4. Levothyroxine 125 mcg daily.  5. Lovastatin 40 mg daily.  6. Metoprolol 25 mg b.i.d. 7. Montelukast 10 mg daily.  8. Nitroglycerin 2% ointment 1 inch topical b.i.d. 9. Aspirin 325 mg daily. 10. Clopidogrel 75 mg daily.  11. Insulin Lantus 50 units q.24 hours.  12. Sliding scale insulin.  13. Heparin drip.  14. Tylox 1 to 2 tabs every 6 hours p.r.n. for pain.  15. Morphine IV p.r.n. for pain.   SIGNIFICANT LABS AND IMAGING: Initial BUN 47, glucose 391, creatinine 2.76. Creatinine by time of discharge was 2.06, BUN was 33. Initial potassium 4.9, serum CO2 20. Initial troponin was 1.8 and CK-MB of 9.8. Peak troponin of 13.6 and peak CK-MB of 21.8. The last troponin of 6.1. TSH was 0.634. Initial WBC of 4.8, hemoglobin of 11.8 and  platelets of 67. Echocardiogram done this admission showed EF of 45% to 50%, small to moderate inferior myocardial infarct, mildly decreased global LV systolic function, severely elevated pulmonary arterial pressures, moderate increased LV posterior wall thickness, mild to moderate tricuspid regurgitation. Cardiac catheterization on the 8th of July showed 3-vessel CAD, high risk for CABG or PCI, 60% stenosis on the ostial left main. Proximal LAD: There was tubular 90% stenosis. Mid circumflex: There was tubular 70% stenosis. Mid RCA: There was 100% stenosis. Right PDA: Distal vessel was supplied by limited collaterals from the left AV groove artery.    HISTORY OF PRESENT ILLNESS AND HOSPITAL COURSE: For full details of H and P, please see the dictation on July 7 by Dr. Jacques NavyAhmadzia, but briefly, this is a 79 year old with significant history of peripheral arterial disease, including CAD and MI, CHF, who came in for an episode of chest pain and was noted to have elevated troponin and CK-MB and EKG abnormalities. Was admitted to the hospitalist service for non-ST elevation MI. She had increased ST depressions from her baseline in multiple leads, including V3 to V6. Cardiology was consulted. Given low GFR and acute on chronic renal failure, also nephrology was consulted. She was started on N-acetylcysteine as well as bicarbonate drip. Creatinine did come back down the following day, and the patient underwent a cardiac catheterization, the result of which is dictated above. Given the significant 3-vessel disease, [facility] was [facility]. She was chest pain-free while she was in the hospital. She had  an episode of several minutes of chest pressure on the right side, which resolved the night before the catheterization.   TOTAL TIME SPENT: 35 minutes.   CODE STATUS: The patient is full code.   ____________________________ Krystal Eaton, MD sa:OSi D: 12/23/2012 08:14:46 ET T: 12/23/2012  08:30:46 ET JOB#: [id number]  cc: Krystal Eaton, MD, <Dictator> Lonie Peak, MD Krystal Eaton MD ELECTRONICALLY SIGNED 01/11/2013 14:33

## 2014-10-09 NOTE — Op Note (Signed)
PATIENT NAME:  [name], [place] C MR#:  [id number] DATE OF BIRTH:  07/23/1935  DATE OF PROCEDURE:  11/20/2011  PREOPERATIVE DIAGNOSES:  1. Peripheral arterial disease with rest pain, left lower extremity.  2. Tobacco dependence.  3. Hypertension.  4. Diabetes.  5. Coronary artery disease.  POSTOPERATIVE DIAGNOSES:  1. Peripheral arterial disease with rest pain, left lower extremity.  2. Tobacco dependence.  3. Hypertension.  4. Diabetes.  5. Coronary artery disease.  PROCEDURES PERFORMED: 1. Ultrasound guidance for vascular access, right femoral artery.  2. Catheter placement into left popliteal artery from right femoral approach.  3. Aortogram and selective left lower extremity angiogram.  4. Percutaneous transluminal angioplasty of left superficial femoral and above-knee popliteal artery with 5 and 6 mm diameter angioplasty balloons.  5. Self-expanding stent placement, 6 mm diameter x 10 cm length to the left popliteal artery for greater than 50% residual stenosis after angioplasty.  6. Self-expanding stent placement to the proximal SFA with 7 mm diameter self-expanding stent for greater than 50% residual stenosis after angioplasty.  7. StarClose closure device, right femoral artery.   SURGEON: Annice NeedyJason S. Dew, M.D.   ANESTHESIA: Local with moderate conscious sedation.   ESTIMATED BLOOD LOSS: 25 mL.   INDICATION FOR PROCEDURE: This is a 79 year old female with multiple medical issues and severe peripheral vascular disease. She has had multiple interventions bilaterally. She continues to smoke. She is brought in for recurrent rest pain in the left lower extremity with occlusion of her left SFA interventions. The risks and benefits were discussed and informed consent was obtained.   DESCRIPTION OF PROCEDURE: The patient was brought to the vascular interventional radiology suite. Groins were shaved and prepped and a sterile surgical field was created. The right femoral head was localized  with fluoroscopy. Due to poor anatomic landmarks and multiple previous accesses ultrasound was used to access the right femoral artery. This was done without difficulty with a Seldinger needle. A J-wire was placed. After skin nick and dilatation, the 5 French sheath was placed over the wire and pigtail catheter was placed in the aorta at the L1 level. AP aortogram showed an ectatic aorta with severe calcification and some mild iliac stenosis that did not appear flow limiting. The renal vessels were patent bilaterally with mild stenosis. I then hooked the aortic bifurcation and advanced to the left femoral head and selective left lower extremity angiogram was then performed. This demonstrated an occlusion of the superficial femoral artery a couple centimeters beyond its origin. This was well above the previously placed stent. The stent was occluded which reconstituted just below the stent, in the popliteal artery. There was two-vessel runoff distally. I then used a 6 [place] Ansel sheath and a Terumo Advantage wire and with the help of a Kumpe catheter I was able to cross this lesion without difficulty and confirm intraluminal flow, in the popliteal artery below the occlusion. I replaced the 0.035 wire. A 5 mm diameter angioplasty balloon was inflated in the popliteal artery, at the level of the knee and above, and then a 6 mm diameter angioplasty balloon was inflated the length of the SFA and most of the above-knee popliteal artery. Following this, at the reentry point, there was still residual stenosis that was treated with a 6 mm diameter self-expanding stent ironed out with a 5 mm and a 6 mm balloon and, at the proximal entry point, there was still greater than 50% residual stenosis that was also treated with a self-expanding stent. This was  2 to 3 cm below the origin of the superficial femoral artery so it was not at the femoral bifurcation. Following this there was still some residual old thrombus appearance  within the mid SFA stent. I elected to run her on 6 hours of Integrilin following the procedure to help treat this. The sheath was pulled back to the ipsilateral external iliac artery and oblique           arteriogram was performed. A StarClose closure device was deployed in the usual fashion with excellent hemostatic result. The patient tolerated the procedure well and was taken to the recovery room in stable condition.  ____________________________ Annice Needy, MD jsd:slb D: 11/20/2011 10:36:54 ET T: 11/20/2011 10:49:45 ET JOB#: [id number]  cc: Annice Needy, MD, <Dictator> Lonie Peak, MD Annice Needy MD ELECTRONICALLY SIGNED 11/21/2011 13:14
